# Patient Record
Sex: Female | Born: 1985 | Race: White | Hispanic: No | Marital: Single | Smoking: Former smoker
Health system: Southern US, Community
[De-identification: ages and names within clinical notes are randomized; demographics above are authoritative.]

## PROBLEM LIST (undated history)

## (undated) ENCOUNTER — Encounter: Attending: Allergy & Immunology | Primary: Allergy & Immunology

## (undated) ENCOUNTER — Telehealth

## (undated) ENCOUNTER — Ambulatory Visit
Payer: MEDICARE | Attending: Rehabilitative and Restorative Service Providers" | Primary: Rehabilitative and Restorative Service Providers"

## (undated) ENCOUNTER — Encounter

## (undated) ENCOUNTER — Ambulatory Visit: Payer: MEDICARE

## (undated) ENCOUNTER — Ambulatory Visit: Payer: MEDICARE | Attending: Orthopaedic Surgery | Primary: Orthopaedic Surgery

## (undated) ENCOUNTER — Telehealth
Attending: Student in an Organized Health Care Education/Training Program | Primary: Student in an Organized Health Care Education/Training Program

## (undated) ENCOUNTER — Encounter: Attending: Pharmacist | Primary: Pharmacist

## (undated) ENCOUNTER — Ambulatory Visit

## (undated) ENCOUNTER — Telehealth: Attending: Pediatrics | Primary: Pediatrics

## (undated) ENCOUNTER — Ambulatory Visit: Attending: Allergy & Immunology | Primary: Allergy & Immunology

## (undated) ENCOUNTER — Ambulatory Visit: Attending: Psychiatry | Primary: Psychiatry

## (undated) ENCOUNTER — Telehealth: Attending: Rheumatology | Primary: Rheumatology

## (undated) ENCOUNTER — Telehealth: Attending: Physical Medicine & Rehabilitation | Primary: Physical Medicine & Rehabilitation

## (undated) ENCOUNTER — Telehealth: Attending: Allergy & Immunology | Primary: Allergy & Immunology

## (undated) ENCOUNTER — Encounter: Attending: Pediatrics | Primary: Pediatrics

## (undated) ENCOUNTER — Ambulatory Visit: Payer: MEDICARE | Attending: Allergy & Immunology | Primary: Allergy & Immunology

## (undated) ENCOUNTER — Encounter
Attending: Student in an Organized Health Care Education/Training Program | Primary: Student in an Organized Health Care Education/Training Program

## (undated) ENCOUNTER — Ambulatory Visit: Attending: Pharmacist | Primary: Pharmacist

## (undated) ENCOUNTER — Telehealth: Attending: Family | Primary: Family

## (undated) ENCOUNTER — Ambulatory Visit: Payer: MEDICARE | Attending: Physical Medicine & Rehabilitation | Primary: Physical Medicine & Rehabilitation

## (undated) ENCOUNTER — Encounter: Attending: Physical Medicine & Rehabilitation | Primary: Physical Medicine & Rehabilitation

## (undated) DIAGNOSIS — J45909 Unspecified asthma, uncomplicated: Secondary | ICD-10-CM

## (undated) DIAGNOSIS — M359 Systemic involvement of connective tissue, unspecified: Secondary | ICD-10-CM

## (undated) DIAGNOSIS — I10 Essential (primary) hypertension: Secondary | ICD-10-CM

## (undated) DIAGNOSIS — N289 Disorder of kidney and ureter, unspecified: Secondary | ICD-10-CM

## (undated) HISTORY — PX: VASCULAR SURGERY: SHX849

## (undated) HISTORY — PX: COLON SURGERY: SHX602

## (undated) HISTORY — PX: CERVICAL FUSION: SHX112

## (undated) MED ORDER — DUPILUMAB 300 MG/2 ML SUBCUTANEOUS SYRINGE: SUBCUTANEOUS | 0 days

---
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
## 2007-05-03 ENCOUNTER — Other Ambulatory Visit: Payer: Self-pay

## 2007-05-04 ENCOUNTER — Ambulatory Visit: Payer: Self-pay | Admitting: *Deleted

## 2007-05-04 ENCOUNTER — Inpatient Hospital Stay (HOSPITAL_COMMUNITY): Admission: AD | Admit: 2007-05-04 | Discharge: 2007-05-07 | Payer: Self-pay | Admitting: *Deleted

## 2010-12-06 NOTE — H&P (Signed)
[name], [name] NO.:  [id number]   MEDICAL RECORD NO.:  [id number]          PATIENT TYPE:  IPS   LOCATION:  0301                          FACILITY:  BH   PHYSICIAN:  [name], M.D. DATE OF BIRTH:  1986-06-13   DATE OF ADMISSION:  05/04/2007  DATE OF DISCHARGE:                       PSYCHIATRIC ADMISSION ASSESSMENT   IDENTIFYING INFORMATION:  This is a voluntary admission to the services  of Dr. Milford Macias.  This is a 25 year old single white female.  She  presented as a walk-in here at the [facility] yesterday.  She reported that she had just been discharged from [facility] on  April 29, 2007 after treatment for depression and anorexia.  She was  over here in [place] visiting a friend.  She began to have suicidal  ideation.  She stated that she had been having frequent down moods  since her discharge and she was concerned that she might act on her  suicidal ideation and did not feel safe.  She could not identify any  particular stressors and she reported that she had been eating regularly  and had been compliant with her medications.  She was calm, cooperative  and requesting treatment.  Apparently, she has overdosed on medication  and cut her wrist in the past and this was back in January when she was  admitted to [facility] and she has been trying to avoid suicidal ideation but  it is not working.  She was brought to the [facility] ED for medical  clearance with anticipation of being admitted to [facility].  However, no beds  were available and, hence, she was admitted to the [facility].   PAST PSYCHIATRIC HISTORY:  Most recently discharged from [facility] April 29, 2007.  She also was an inpatient in January of 2008 at [facility].  She states  that for the past eight years she has had treatment for depression and  anorexia including several treatment centers for anorexia including the  [facility] in [place].   SOCIAL  HISTORY:  She currently lives alone in [place].  She works at [employer].  She has never married.  She has no children.  She has taken a  few college courses.  Her mother lives in [place].  Her father lives in  [place] state.   FAMILY HISTORY:  Father and sister abuse alcohol.   ALCOHOL/DRUG HISTORY:  She herself uses marijuana occasionally and has  since age 23.   PRIMARY CARE PHYSICIAN:  Currently, she does not have any identified  physicians.   MEDICAL PROBLEMS:  Orthostatic hypotension and history for anorexia.   POSITIVE PHYSICAL FINDINGS:  She was medically cleared in the ED at  [facility].  Her UDS was negative.  Her alcohol level was less than 5.  Her other labs were within normal limits.  Vital signs, on admission,  show she is 65-1/2 inches tall, she weighs 116 pounds, temperature 98,  blood pressure 117/77, pulse 76-110, respirations 20.  She does have  numerous old self-inflicted scars.  She does have a past history for  having lived  in foster care and group homes.   MEDICATIONS:  She is currently prescribed Effexor XR 300 mg a day and  Lunesta 3 mg at h.s.   ALLERGIES:  No known drug allergies.   MENTAL STATUS EXAM:  Today, she is alert and oriented x3.  She is  appropriately groomed, dressed and nourished.  She has good eye contact.  Her speech is a normal rate, rhythm and tone.  Her mood she reports  feeling depressed at times.  Her affect has a normal range.  Thought  processes are clear, rational and goal-oriented.  She wants to be safe.  Judgment and insight are intact.  Concentration and memory are intact  and intelligence is at least average.  She is having occasional suicidal  ideation.  She denies homicidal ideation and she does not have auditory  or visual hallucinations.   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  History for anorexia nervosa.  AXIS II:  Deferred.  Foster care and group homes.   AXIS III:  Orthostatic hypotension.  AXIS IV:  Moderate.  AXIS V:  35.   PLAN:  To admit for safety and stabilization.  Will get her discharge  summary from [facility].  We will adjust her medications if indicated.  She  will be going to the depression groups.   ESTIMATED LENGTH OF STAY:  Three to four days.      Mickie Leonarda Salon, P.A.-C.      [name], M.D.  Electronically Signed    MD/MEDQ  D:  05/04/2007  T:  05/04/2007  Job:  [id number]

## 2010-12-09 NOTE — Discharge Summary (Signed)
[name], KIMBLER NO.:  [id number]   MEDICAL RECORD NO.:  [id number]          PATIENT TYPE:  IPS   LOCATION:  0601                          FACILITY:  BH   PHYSICIAN:  Jasmine Pang, M.D. DATE OF BIRTH:  09/21/1985   DATE OF ADMISSION:  05/04/2007  DATE OF DISCHARGE:  05/07/2007                               DISCHARGE SUMMARY   IDENTIFYING INFORMATION:  This is a 25 year old single white female who  was admitted on a voluntary basis.   HISTORY OF PRESENT ILLNESS:  The patient presented as a walk-in here at  the [facility] yesterday.  On the day before admission, she  reported she had been discharged from [facility] on April 29, 2007,  after treatment for depression and anorexia.  She was in [place]  visiting a friend, she began to have suicidal ideation.  She stated that  she had been having frequent down moods since her discharge, and she  was concerned that she might act on her suicidal ideation and did not  feel safe.  She could not identify any particular stressors and she  reported that she had been eating regularly and has been compliant with  her medication.  She was calm, cooperative, and requesting treatment.  Apparently, she has overdosed her medication and cut her wrists in the  past.  This was in January when she was admitted to [facility]  Psychiatric Unit.  She states she has been trying to avoid the suicidal  ideation, but it is not working.  She was brought to the [facility]  ED for medical clearance with anticipation of being admitted to [facility].  However, no beds were available and hence she was admitted to [facility].  She was most recently discharged from [facility] on April 29, 2007.  She was also an inpatient in January 2008 at [facility].  She states  that for the past 8 years, she has had treatment for depression and  anorexia including several treatment centers for anorexia.  She states  that her father and  sister abuse alcohol.  She herself uses marijuana  occasionally and has since the age of 72.  She has orthostatic  hypotension.   PHYSICAL FINDINGS:  The patient was medically cleared in the ED at  [facility].  There were no acute physical or medical problems  noted.  Her UDS was negative.  Her alcohol level was less than 5.  Her  other labs were within normal limits done at the ED.   HOSPITAL COURSE:  Upon admission, the patient was continued on her  Effexor XR 300 mg p.o. daily and Lunesta 3 mg p.o. q.h.s.  She was  ordered a rapid strep test due to sore throat.  This was negative.  The  patient tolerated her medications well with no significant side effects.  There were no changes made.  She initially was reserved, friendly, and  cooperative in sessions with me.  She was able to participate  appropriately in unit therapeutic groups and activities.  She talked  about being in treatment on  and off for the past 8 years.  She discussed  her suicidal ideation prior to admit not safe.  She lives in [place]  and works at a [employer] place.  She denies any stressors.  She  states she has no psychiatrist at this point, but does have a therapist.  As hospitalization progressed, mental status continued to be depressed  and very anxious.  Her sleep was good and there was no suicidal  ideation.  She discussed her sister being a big support for her, but  stated that she had conflict with her mother.  She wanted a transfer to  [facility] or [facility], but there were no beds available.  On May 06, 2007, mood had improved.  There was no suicidal ideation.  The patient  felt ready to go home the next day.  On May 07, 2007, mental status  had improved from admission status.  The patient was friendly and  cooperative with good eye contact.  Speech was normal rate and flow.  Psychomotor activity was within normal limits.  Mood euthymic.  Affect  wide range with no suicidal or  homicidal ideation.  No thoughts of self-  injurious behavior.  No auditory or visual hallucinations.  No paranoia  or delusions.  Thoughts were logical and goal-directed.  Thought  content, no predominant theme.  Cognitive was grossly back to baseline.  It was felt the patient was safe to be discharged today.  Her friend was  picking her up and will take her home.  She plans to stay with this  friend for several days since she does not have to be at work  immediately.   DISCHARGE DIAGNOSES:  AXIS I:  Major depression, recurrent, severe  without psychotic features, anorexia nervosa.  AXIS II:  None.  AXIS III:  Orthostatic hypotension and anorexia nervosa.  AXIS IV:  Moderate (burden of psychiatric illness and conflict with  mother).  AXIS V:  Global assessment of functioning was 35 upon admission and 50  upon discharge. Global assessment of functioning highest past year was  55-60.   DISCHARGE PLAN:  There were no specific activity level or dietary  restrictions.   POSTHOSPITAL CARE PLAN:  The patient will be seen at the [facility] Psychiatry  Department.  They were going to call the patient with her appointment.   DISCHARGE MEDICATIONS:  Effexor XR 300 mg daily and Ambien 10 mg at  bedtime.      Jasmine Pang, M.D.  Electronically Signed     BHS/MEDQ  D:  06/03/2007  T:  06/04/2007  Job:  [id number]

## 2011-05-04 LAB — PREGNANCY, URINE: Preg Test, Ur: NEGATIVE

## 2011-05-04 LAB — BASIC METABOLIC PANEL
Chloride: 104
GFR calc Af Amer: >60
Potassium: 4.3

## 2011-05-04 LAB — RAPID STREP SCREEN (MED CTR MEBANE ONLY): Streptococcus, Group A Screen (Direct): NEGATIVE

## 2011-05-04 LAB — ETHANOL: Alcohol, Ethyl (B): <5

## 2017-01-26 ENCOUNTER — Emergency Department
Admission: EM | Admit: 2017-01-26 | Discharge: 2017-01-26 | Disposition: A | Discharge status: Home / Self Care | Payer: MEDICARE | Source: Intra-hospital

## 2017-03-01 ENCOUNTER — Ambulatory Visit: Admission: RE | Admit: 2017-03-01 | Discharge: 2017-03-01 | Payer: MEDICARE

## 2017-03-01 DIAGNOSIS — J45909 Unspecified asthma, uncomplicated: Principal | ICD-10-CM

## 2017-04-08 ENCOUNTER — Emergency Department
Admission: EM | Admit: 2017-04-08 | Discharge: 2017-04-09 | Disposition: A | Discharge status: Home / Self Care | Payer: MEDICARE | Source: Intra-hospital

## 2017-04-08 ENCOUNTER — Ambulatory Visit
Admission: RE | Admit: 2017-04-08 | Discharge: 2017-04-08 | Payer: MEDICARE | Attending: Nurse Practitioner | Admitting: Nurse Practitioner

## 2017-04-08 ENCOUNTER — Emergency Department
Admission: EM | Admit: 2017-04-08 | Discharge: 2017-04-09 | Disposition: A | Discharge status: Home / Self Care | Source: Intra-hospital

## 2017-04-08 DIAGNOSIS — T7840XA Allergy, unspecified, initial encounter: Principal | ICD-10-CM

## 2017-04-09 DIAGNOSIS — R1110 Vomiting, unspecified: Principal | ICD-10-CM

## 2017-04-09 MED ORDER — ONDANSETRON 4 MG DISINTEGRATING TABLET
ORAL_TABLET | Freq: Three times a day (TID) | ORAL | 0 refills | 0.00000 days | Status: CP | PRN
Start: 2017-04-09 — End: 2017-04-16

## 2017-04-13 ENCOUNTER — Emergency Department
Admission: EM | Admit: 2017-04-13 | Discharge: 2017-04-13 | Disposition: A | Discharge status: Home / Self Care | Payer: MEDICARE | Source: Intra-hospital

## 2017-04-13 ENCOUNTER — Emergency Department
Admission: EM | Admit: 2017-04-13 | Discharge: 2017-04-13 | Disposition: A | Discharge status: Home / Self Care | Payer: MEDICARE | Source: Intra-hospital | Attending: Emergency Medicine | Admitting: Emergency Medicine

## 2017-04-13 DIAGNOSIS — R102 Pelvic and perineal pain: Principal | ICD-10-CM

## 2017-04-26 ENCOUNTER — Ambulatory Visit: Admission: RE | Admit: 2017-04-26 | Discharge: 2017-04-26 | Payer: MEDICARE | Admitting: Allergy & Immunology

## 2017-04-26 DIAGNOSIS — J45909 Unspecified asthma, uncomplicated: Secondary | ICD-10-CM

## 2017-04-26 DIAGNOSIS — D8940 Mast cell activation, unspecified: Principal | ICD-10-CM

## 2017-04-26 DIAGNOSIS — L501 Idiopathic urticaria: Secondary | ICD-10-CM

## 2017-04-26 DIAGNOSIS — W57XXXA Bitten or stung by nonvenomous insect and other nonvenomous arthropods, initial encounter: Secondary | ICD-10-CM

## 2017-04-26 DIAGNOSIS — R232 Flushing: Secondary | ICD-10-CM

## 2017-04-26 DIAGNOSIS — R21 Rash and other nonspecific skin eruption: Secondary | ICD-10-CM

## 2017-04-26 DIAGNOSIS — L853 Xerosis cutis: Secondary | ICD-10-CM

## 2017-07-22 ENCOUNTER — Emergency Department
Admission: EM | Admit: 2017-07-22 | Discharge: 2017-07-22 | Disposition: A | Discharge status: Home / Self Care | Payer: MEDICARE | Source: Intra-hospital | Attending: Family | Admitting: Family

## 2017-08-10 ENCOUNTER — Ambulatory Visit: Admit: 2017-08-10 | Discharge: 2017-08-10 | Disposition: A | Discharge status: Home / Self Care | Payer: MEDICARE

## 2017-09-15 ENCOUNTER — Ambulatory Visit
Admit: 2017-09-15 | Discharge: 2017-09-15 | Disposition: A | Discharge status: Home / Self Care | Payer: MEDICARE | Attending: Emergency Medicine

## 2017-09-20 ENCOUNTER — Emergency Department
Admit: 2017-09-20 | Discharge: 2017-09-21 | Disposition: A | Discharge status: Home / Self Care | Payer: MEDICARE | Attending: Emergency Medicine

## 2017-09-20 ENCOUNTER — Ambulatory Visit
Admit: 2017-09-20 | Discharge: 2017-09-21 | Disposition: A | Discharge status: Home / Self Care | Payer: MEDICARE | Attending: Emergency Medicine

## 2017-09-24 ENCOUNTER — Ambulatory Visit
Admit: 2017-09-24 | Discharge: 2017-09-24 | Disposition: A | Discharge status: Home / Self Care | Payer: MEDICARE | Attending: Critical Care Medicine

## 2017-10-11 ENCOUNTER — Ambulatory Visit: Admit: 2017-10-11 | Discharge: 2017-10-11 | Disposition: A | Discharge status: Home / Self Care | Payer: MEDICARE

## 2017-10-11 ENCOUNTER — Emergency Department: Admit: 2017-10-11 | Discharge: 2017-10-11 | Disposition: A | Discharge status: Home / Self Care | Payer: MEDICARE

## 2017-10-11 DIAGNOSIS — S61412A Laceration without foreign body of left hand, initial encounter: Principal | ICD-10-CM

## 2017-10-22 DIAGNOSIS — S61211A Laceration without foreign body of left index finger without damage to nail, initial encounter: Principal | ICD-10-CM

## 2017-10-23 ENCOUNTER — Ambulatory Visit: Admit: 2017-10-23 | Discharge: 2017-10-23 | Payer: MEDICARE

## 2017-10-23 ENCOUNTER — Encounter: Admit: 2017-10-23 | Discharge: 2017-10-23 | Payer: MEDICARE | Attending: Anesthesiology | Primary: Anesthesiology

## 2017-10-23 DIAGNOSIS — S61211A Laceration without foreign body of left index finger without damage to nail, initial encounter: Principal | ICD-10-CM

## 2017-10-23 MED ORDER — HYDROMORPHONE 2 MG TABLET
ORAL_TABLET | ORAL | 0 refills | 0.00000 days | Status: CP | PRN
Start: 2017-10-23 — End: ?

## 2017-10-26 ENCOUNTER — Emergency Department: Admit: 2017-10-26 | Discharge: 2017-10-26 | Disposition: A | Discharge status: Home / Self Care | Payer: MEDICARE

## 2017-10-26 ENCOUNTER — Ambulatory Visit: Admit: 2017-10-26 | Discharge: 2017-10-26 | Disposition: A | Discharge status: Home / Self Care | Payer: MEDICARE

## 2017-10-26 DIAGNOSIS — R229 Localized swelling, mass and lump, unspecified: Principal | ICD-10-CM

## 2017-11-05 ENCOUNTER — Ambulatory Visit: Admit: 2017-11-05 | Discharge: 2017-11-05 | Disposition: A | Discharge status: Home / Self Care | Payer: MEDICARE

## 2017-11-08 ENCOUNTER — Ambulatory Visit
Admit: 2017-11-08 | Discharge: 2017-12-07 | Payer: MEDICARE | Attending: Rehabilitative and Restorative Service Providers" | Primary: Rehabilitative and Restorative Service Providers"

## 2017-11-08 ENCOUNTER — Ambulatory Visit
Admit: 2017-11-08 | Discharge: 2017-11-09 | Payer: MEDICARE | Attending: Orthopaedic Surgery | Primary: Orthopaedic Surgery

## 2017-11-08 DIAGNOSIS — M25642 Stiffness of left hand, not elsewhere classified: Principal | ICD-10-CM

## 2017-11-08 DIAGNOSIS — S6440XA Injury of digital nerve of unspecified finger, initial encounter: Principal | ICD-10-CM

## 2017-11-08 DIAGNOSIS — Z9889 Other specified postprocedural states: Secondary | ICD-10-CM

## 2017-11-12 DIAGNOSIS — Z9889 Other specified postprocedural states: Principal | ICD-10-CM

## 2017-11-12 DIAGNOSIS — R202 Paresthesia of skin: Secondary | ICD-10-CM

## 2017-11-12 DIAGNOSIS — M542 Cervicalgia: Principal | ICD-10-CM

## 2017-11-12 DIAGNOSIS — M25642 Stiffness of left hand, not elsewhere classified: Secondary | ICD-10-CM

## 2017-11-13 ENCOUNTER — Ambulatory Visit
Admit: 2017-11-13 | Discharge: 2017-11-13 | Disposition: A | Discharge status: Home / Self Care | Payer: MEDICARE | Attending: Family

## 2017-11-13 ENCOUNTER — Emergency Department
Admit: 2017-11-13 | Discharge: 2017-11-13 | Disposition: A | Discharge status: Home / Self Care | Payer: MEDICARE | Attending: Family

## 2017-12-06 ENCOUNTER — Ambulatory Visit: Admit: 2017-12-06 | Discharge: 2017-12-07 | Disposition: A | Discharge status: Home / Self Care | Payer: MEDICARE

## 2017-12-31 MED ORDER — SODIUM CHLORIDE 0.9 % FOR NEBULIZATION
Freq: Four times a day (QID) | RESPIRATORY_TRACT | 12 refills | 0.00000 days | Status: CP | PRN
Start: 2017-12-31 — End: 2018-12-31

## 2017-12-31 MED ORDER — NEBULIZER AND COMPRESSOR
Freq: Four times a day (QID) | 1 refills | 0 days | Status: CP | PRN
Start: 2017-12-31 — End: ?

## 2017-12-31 MED ORDER — ALBUTEROL SULFATE HFA 90 MCG/ACTUATION AEROSOL INHALER
Freq: Four times a day (QID) | RESPIRATORY_TRACT | 11 refills | 0 days | Status: CP | PRN
Start: 2017-12-31 — End: 2018-12-17

## 2018-02-18 MED ORDER — HYDROXYCHLOROQUINE 200 MG TABLET
ORAL_TABLET | Freq: Two times a day (BID) | ORAL | 2 refills | 0 days | Status: CP
Start: 2018-02-18 — End: 2018-07-15

## 2018-02-18 MED ORDER — FAMOTIDINE 20 MG TABLET
ORAL_TABLET | Freq: Two times a day (BID) | ORAL | 11 refills | 0.00000 days | Status: SS
Start: 2018-02-18 — End: 2018-03-28

## 2018-02-19 MED ORDER — CROMOLYN 100 MG/5 ML ORAL CONCENTRATE
Freq: Four times a day (QID) | ORAL | 11 refills | 0.00000 days | Status: CP
Start: 2018-02-19 — End: 2019-02-06

## 2018-03-16 ENCOUNTER — Ambulatory Visit
Admit: 2018-03-16 | Discharge: 2018-03-16 | Disposition: A | Discharge status: Home / Self Care | Payer: MEDICARE | Attending: Emergency Medicine

## 2018-03-26 ENCOUNTER — Ambulatory Visit
Admit: 2018-03-26 | Discharge: 2018-03-28 | Disposition: A | Discharge status: Home / Self Care | Payer: MEDICARE | Admitting: Internal Medicine

## 2018-03-28 MED ORDER — ENOXAPARIN 80 MG/0.8 ML SUBCUTANEOUS SYRINGE
INJECTION | Freq: Two times a day (BID) | SUBCUTANEOUS | 0 refills | 0 days | Status: CP
Start: 2018-03-28 — End: ?

## 2018-03-28 MED ORDER — HYDROXYZINE PAMOATE 100 MG CAPSULE
ORAL_CAPSULE | Freq: Every evening | ORAL | 0 refills | 0.00000 days | Status: CP
Start: 2018-03-28 — End: ?

## 2018-03-28 MED ORDER — FAMOTIDINE 40 MG TABLET
ORAL_TABLET | Freq: Two times a day (BID) | ORAL | 0 refills | 0 days | Status: CP
Start: 2018-03-28 — End: 2018-04-27

## 2018-03-28 MED ORDER — HYDROXYZINE PAMOATE 25 MG CAPSULE
ORAL_CAPSULE | Freq: Four times a day (QID) | ORAL | 0 refills | 0 days | PRN
Start: 2018-03-28 — End: 2018-10-16

## 2018-03-28 MED ORDER — FEXOFENADINE 180 MG TABLET
ORAL_TABLET | Freq: Two times a day (BID) | ORAL | 12 refills | 0.00000 days
Start: 2018-03-28 — End: ?

## 2018-04-04 ENCOUNTER — Ambulatory Visit
Admit: 2018-04-04 | Discharge: 2018-04-05 | Payer: MEDICARE | Attending: Allergy & Immunology | Primary: Allergy & Immunology

## 2018-04-04 DIAGNOSIS — D8940 Mast cell activation, unspecified: Principal | ICD-10-CM

## 2018-04-04 DIAGNOSIS — L0100 Impetigo, unspecified: Secondary | ICD-10-CM

## 2018-04-04 DIAGNOSIS — L501 Idiopathic urticaria: Secondary | ICD-10-CM

## 2018-04-04 MED ORDER — MUPIROCIN 2 % TOPICAL OINTMENT
Freq: Three times a day (TID) | TOPICAL | 1 refills | 0 days | Status: CP
Start: 2018-04-04 — End: 2018-04-25

## 2018-04-25 MED ORDER — MUPIROCIN 2 % TOPICAL OINTMENT
Freq: Three times a day (TID) | TOPICAL | 2 refills | 0.00000 days | Status: CP
Start: 2018-04-25 — End: 2018-05-09

## 2018-05-02 ENCOUNTER — Institutional Professional Consult (permissible substitution): Admit: 2018-05-02 | Discharge: 2018-05-03 | Payer: MEDICARE

## 2018-05-02 DIAGNOSIS — L501 Idiopathic urticaria: Principal | ICD-10-CM

## 2018-05-02 DIAGNOSIS — J45909 Unspecified asthma, uncomplicated: Secondary | ICD-10-CM

## 2018-06-08 ENCOUNTER — Emergency Department
Admission: EM | Admit: 2018-06-08 | Discharge: 2018-06-09 | Disposition: A | Discharge status: Home / Self Care | Payer: Medicare Other | Attending: Emergency Medicine | Admitting: Emergency Medicine

## 2018-06-08 DIAGNOSIS — R197 Diarrhea, unspecified: Secondary | ICD-10-CM | POA: Insufficient documentation

## 2018-06-08 DIAGNOSIS — E860 Dehydration: Secondary | ICD-10-CM

## 2018-06-08 DIAGNOSIS — R1084 Generalized abdominal pain: Secondary | ICD-10-CM | POA: Diagnosis not present

## 2018-06-08 DIAGNOSIS — R112 Nausea with vomiting, unspecified: Secondary | ICD-10-CM | POA: Diagnosis not present

## 2018-06-08 DIAGNOSIS — R531 Weakness: Secondary | ICD-10-CM | POA: Diagnosis present

## 2018-06-08 DIAGNOSIS — Z7901 Long term (current) use of anticoagulants: Secondary | ICD-10-CM | POA: Insufficient documentation

## 2018-06-08 HISTORY — DX: Essential (primary) hypertension: I10

## 2018-06-08 HISTORY — DX: Systemic involvement of connective tissue, unspecified: M35.9

## 2018-06-08 HISTORY — DX: Unspecified asthma, uncomplicated: J45.909

## 2018-06-08 HISTORY — DX: Disorder of kidney and ureter, unspecified: N28.9

## 2018-06-08 LAB — CBC
HEMATOCRIT: 44.6 % (ref 36.0–46.0)
Hemoglobin: 15.2 g/dL — ABNORMAL HIGH (ref 12.0–15.0)
MCH: 29.5 pg (ref 26.0–34.0)
MCHC: 34.1 g/dL (ref 30.0–36.0)
MCV: 86.4 fL (ref 80.0–100.0)
Platelets: 229 10*3/uL (ref 150–400)
RBC: 5.16 MIL/uL — AB (ref 3.87–5.11)
RDW: 13.1 % (ref 11.5–15.5)
WBC: 10.3 10*3/uL (ref 4.0–10.5)
nRBC: 0 % (ref 0.0–0.2)

## 2018-06-08 NOTE — ED Triage Notes (Signed)
Pt arrived via EMS from home where she has had N/V and generalized abdominal pain. Tonight pt had near syncopal episode. Pt is hypotensive 67/46.

## 2018-06-09 ENCOUNTER — Encounter: Payer: Self-pay | Admitting: Radiology

## 2018-06-09 ENCOUNTER — Emergency Department: Payer: Medicare Other

## 2018-06-09 LAB — COMPREHENSIVE METABOLIC PANEL
ALT: 31 U/L (ref 0–44)
AST: 30 U/L (ref 15–41)
Albumin: 4.1 g/dL (ref 3.5–5.0)
Alkaline Phosphatase: 42 U/L (ref 38–126)
Anion gap: 7 (ref 5–15)
BUN: 19 mg/dL (ref 6–20)
CHLORIDE: 105 mmol/L (ref 98–111)
CO2: 23 mmol/L (ref 22–32)
Calcium: 9.3 mg/dL (ref 8.9–10.3)
Creatinine, Ser: 1.07 mg/dL — ABNORMAL HIGH (ref 0.44–1.00)
GFR calc Af Amer: >60 mL/min (ref >60)
GFR calc non Af Amer: >60 mL/min (ref >60)
Glucose, Bld: 101 mg/dL — ABNORMAL HIGH (ref 70–99)
POTASSIUM: 4 mmol/L (ref 3.5–5.1)
Sodium: 135 mmol/L (ref 135–145)
Total Bilirubin: 0.6 mg/dL (ref 0.3–1.2)
Total Protein: 7 g/dL (ref 6.5–8.1)

## 2018-06-09 LAB — TROPONIN I

## 2018-06-09 LAB — URINALYSIS, COMPLETE (UACMP) WITH MICROSCOPIC
Bilirubin Urine: NEGATIVE
Glucose, UA: NEGATIVE mg/dL
Ketones, ur: NEGATIVE mg/dL
Leukocytes, UA: NEGATIVE
Nitrite: NEGATIVE
PROTEIN: NEGATIVE mg/dL
Specific Gravity, Urine: 1.019 (ref 1.005–1.030)
pH: 5 (ref 5.0–8.0)

## 2018-06-09 LAB — LIPASE, BLOOD: Lipase: 27 U/L (ref 11–51)

## 2018-06-09 MED ORDER — HYDROMORPHONE HCL 1 MG/ML IJ SOLN
1.0000 mg | Freq: Once | INTRAMUSCULAR | Status: AC
  Administered 2018-06-09: 1 mg via INTRAVENOUS
  Filled 2018-06-09: qty 1

## 2018-06-09 MED ORDER — CIPROFLOXACIN HCL 500 MG PO TABS: 500.0000 mg | ORAL_TABLET | Freq: Two times a day (BID) | ORAL | 0 refills | Status: DC

## 2018-06-09 MED ORDER — ONDANSETRON 4 MG PO TBDP: 4.0000 mg | ORAL_TABLET | Freq: Three times a day (TID) | ORAL | 0 refills | Status: DC | PRN

## 2018-06-09 MED ORDER — IOPAMIDOL (ISOVUE-300) INJECTION 61%
30.0000 mL | Freq: Once | INTRAVENOUS | Status: AC
  Administered 2018-06-09: 30 mL via ORAL

## 2018-06-09 MED ORDER — IOHEXOL 300 MG/ML  SOLN
100.0000 mL | Freq: Once | INTRAMUSCULAR | Status: AC | PRN
  Administered 2018-06-09: 100 mL via INTRAVENOUS

## 2018-06-09 MED ORDER — SODIUM CHLORIDE 0.9 % IV BOLUS
1000.0000 mL | Freq: Once | INTRAVENOUS | Status: AC
  Administered 2018-06-09: 1000 mL via INTRAVENOUS

## 2018-06-09 MED ORDER — ONDANSETRON HCL 4 MG/2ML IJ SOLN
4.0000 mg | Freq: Once | INTRAMUSCULAR | Status: AC
  Administered 2018-06-09: 4 mg via INTRAVENOUS
  Filled 2018-06-09: qty 2

## 2018-06-09 NOTE — ED Provider Notes (Signed)
[facility] Emergency Department Provider Note   ____________________________________________   First MD Initiated Contact with Patient 06/09/18 0032     (approximate)  I have reviewed the triage vital signs and the nursing notes.   HISTORY  Chief Complaint Weakness    HPI [name] is a 32 y.o. female who presents to the ED from home via EMS with a chief complaint of generalized abdominal pain, nausea, vomiting and diarrhea.  Patient reports symptoms x1 week.  Diarrhea worse over the past 2 days.  Tonight she had a near syncopal episode and arrives with a blood pressure of 67/46.  Denies fever, chills, chest pain, shortness of breath.  Denies recent travel or trauma.  Denies urinary or vaginal complaints.   Past medical history Multiple blood clots On Lovenox  There are no active problems to display for this patient.  Past surgical history Abdominal surgery for clot  Prior to Admission medications   Not on File    Allergies Compazine [prochlorperazine]; Morphine and related; Phenergan [promethazine]; Reglan [metoclopramide]; and Vicodin [hydrocodone-acetaminophen]  No family history on file.  Social History Social History   Tobacco Use  . Smoking status: Not on file  Substance Use Topics  . Alcohol use: Not on file  . Drug use: Not on file    Review of Systems  Constitutional: No fever/chills Eyes: No visual changes. ENT: No sore throat. Cardiovascular: Denies chest pain. Respiratory: Denies shortness of breath. Gastrointestinal: Positive for abdominal pain, nausea, vomiting and diarrhea.  No constipation. Genitourinary: Negative for dysuria. Musculoskeletal: Negative for back pain. Skin: Negative for rash. Neurological: Negative for headaches, focal weakness or numbness.   ____________________________________________   PHYSICAL EXAM:  VITAL SIGNS: ED Triage Vitals [06/08/18 2313]  Enc Vitals Group     BP (!)  67/46     Pulse Rate 60     Resp 17     Temp 98 F (36.7 C)     Temp Source Oral     SpO2 98 %     Weight 210 lb (95.3 kg)     Height 5\' 6"  (1.676 m)     Head Circumference      Peak Flow      Pain Score      Pain Loc      Pain Edu?      Excl. in [place]?     Constitutional: Asleep, awakened for exam.  Alert and oriented. Well appearing and in mild acute distress. Eyes: Conjunctivae are normal. PERRL. EOMI. Head: Atraumatic. Nose: No congestion/rhinnorhea. Mouth/Throat: Mucous membranes are moist.  Oropharynx non-erythematous. Neck: No stridor.   Cardiovascular: Normal rate, regular rhythm. Grossly normal heart sounds.  Good peripheral circulation. Respiratory: Normal respiratory effort.  No retractions. Lungs CTAB. Gastrointestinal: Obese.  Soft and diffusely tender to palpation, maximally at right lower quadrant without rebound or guarding. No distention. No abdominal bruits. No CVA tenderness. Musculoskeletal: No lower extremity tenderness nor edema.  No joint effusions. Neurologic:  Normal speech and language. No gross focal neurologic deficits are appreciated. No gait instability. Skin:  Skin is warm, dry and intact. No rash noted. Psychiatric: Mood and affect are normal. Speech and behavior are normal.  ____________________________________________   LABS (all labs ordered are listed, but only abnormal results are displayed)  Labs Reviewed  COMPREHENSIVE METABOLIC PANEL - Abnormal; Notable for the following components:      Result Value   Glucose, Bld 101 (*)    Creatinine, Ser 1.07 (*)  All other components within normal limits  CBC - Abnormal; Notable for the following components:   RBC 5.16 (*)    Hemoglobin 15.2 (*)    All other components within normal limits  URINALYSIS, COMPLETE (UACMP) WITH MICROSCOPIC - Abnormal; Notable for the following components:   Color, Urine YELLOW (*)    APPearance HAZY (*)    Hgb urine dipstick MODERATE (*)    Bacteria, UA RARE  (*)    All other components within normal limits  C DIFFICILE QUICK SCREEN W PCR REFLEX  GASTROINTESTINAL PANEL BY PCR, STOOL (REPLACES STOOL CULTURE)  LIPASE, BLOOD  TROPONIN I  POC URINE PREG, ED   ____________________________________________  EKG  ED ECG REPORT I, SUNG,JADE J, the attending physician, personally viewed and interpreted this ECG.   Date: 06/09/2018  EKG Time: 2318  Rate: 48  Rhythm: sinus bradycardia  Axis: Normal  Intervals:none  ST&T Change: Nonspecific  ____________________________________________  RADIOLOGY  ED MD interpretation: No acute abnormalities on CT scan  Official radiology report(s): Ct Abdomen Pelvis W Contrast  Result Date: 06/09/2018 CLINICAL DATA:  Nausea, vomiting and abdominal pain. Hypotension. EXAM: CT ABDOMEN AND PELVIS WITH CONTRAST TECHNIQUE: Multidetector CT imaging of the abdomen and pelvis was performed using the standard protocol following bolus administration of intravenous contrast. CONTRAST:  156mL OMNIPAQUE IOHEXOL 300 MG/ML  SOLN COMPARISON:  None. FINDINGS: LOWER CHEST: There is no basilar pleural or apical pericardial effusion. HEPATOBILIARY: The hepatic contours and density are normal. There is no intra- or extrahepatic biliary dilatation. Gallbladder is contracted. PANCREAS: The pancreatic parenchymal contours are normal and there is no ductal dilatation. There is no peripancreatic fluid collection. SPLEEN: Normal. ADRENALS/URINARY TRACT: --Adrenal glands: Normal. --Right kidney/ureter: Right renal atrophy. --Left kidney/ureter: No hydronephrosis, nephroureterolithiasis, perinephric stranding or solid renal mass. --Urinary bladder: Normal for degree of distention STOMACH/BOWEL: --Stomach/Duodenum: There is no hiatal hernia or other gastric abnormality. The duodenal course and caliber are normal. --Small bowel: No dilatation or inflammation. Postsurgical changes of small bowel in the left lower quadrant. --Colon: No focal  abnormality. --Appendix: Normal. VASCULAR/LYMPHATIC: Normal course and caliber of the major abdominal vessels. No abdominal or pelvic lymphadenopathy. REPRODUCTIVE: T-shaped contraceptive device in the uterus. Normal ovaries. MUSCULOSKELETAL. No bony spinal canal stenosis or focal osseous abnormality. OTHER: None. IMPRESSION: 1. No acute abdominal or pelvic abnormality. 2. Postsurgical changes of left lower quadrant small bowel. 3. Abnormal appearance of the right kidney, likely chronic atrophy or postsurgical change. Electronically Signed   By: Ulyses Jarred M.D.   On: 06/09/2018 02:35    ____________________________________________   PROCEDURES  Procedure(s) performed: None  Procedures  Critical Care performed: No  ____________________________________________   INITIAL IMPRESSION / ASSESSMENT AND PLAN / ED COURSE  As part of my medical decision making, I reviewed the following data within the [place] notes reviewed and incorporated, Labs reviewed, Old chart reviewed, Radiograph reviewed  and Notes from prior ED visits   32 year old female who presents with diffuse abdominal pain, nausea, vomiting and diarrhea. Differential diagnosis includes, but is not limited to, ovarian cyst, ovarian torsion, acute appendicitis, diverticulitis, urinary tract infection/pyelonephritis, endometriosis, bowel obstruction, colitis, renal colic, gastroenteritis, hernia, fibroids, endometriosis, pregnancy related pain including ectopic pregnancy, etc.  Laboratory and urinalysis results remarkable for moderate hemoglobin and urine.  Will check stool specimen and proceed with CT abdomen/pelvis to evaluate for intra-abdominal etiology.  Clinical Course as of Jun 09 256  Sun Jun 09, 2018  0255 Patient sleeping no acute distress.  Updated her on CT results.  She has not produced stool sample here.  Will start empiric Cipro.  Discharge home on Zofran and she will follow-up with her  PCP next week.  Strict return precautions given.  Patient verbalizes understanding agrees with plan of care.   [JS]    Clinical Course User Index [JS] Paulette Blanch, MD     ____________________________________________   FINAL CLINICAL IMPRESSION(S) / ED DIAGNOSES  Final diagnoses:  Dehydration  Nausea vomiting and diarrhea  Generalized abdominal pain     ED Discharge Orders    None       Note:  This document was prepared using Dragon voice recognition software and may include unintentional dictation errors.    Paulette Blanch, MD 06/09/18 [phone]

## 2018-06-09 NOTE — Discharge Instructions (Addendum)
1.  You may take Zofran as needed for nausea/vomiting. 2.  Take antibiotic as prescribed (Cipro 500 mg twice daily x3 days). 3.  Clear liquids x12 hours, then [employer] x3 days, then slowly advance diet as tolerated. 4.  Return to the ER for worsening symptoms, persistent vomiting, difficulty breathing or other concerns.

## 2018-06-10 LAB — POCT PREGNANCY, URINE: PREG TEST UR: NEGATIVE

## 2018-06-30 ENCOUNTER — Other Ambulatory Visit: Payer: Self-pay

## 2018-06-30 ENCOUNTER — Emergency Department
Admission: EM | Admit: 2018-06-30 | Discharge: 2018-06-30 | Disposition: A | Discharge status: Home / Self Care | Payer: Medicare Other | Attending: Emergency Medicine | Admitting: Emergency Medicine

## 2018-06-30 ENCOUNTER — Encounter: Payer: Self-pay | Admitting: Emergency Medicine

## 2018-06-30 ENCOUNTER — Emergency Department: Payer: Medicare Other

## 2018-06-30 DIAGNOSIS — J45909 Unspecified asthma, uncomplicated: Secondary | ICD-10-CM | POA: Insufficient documentation

## 2018-06-30 DIAGNOSIS — Z87891 Personal history of nicotine dependence: Secondary | ICD-10-CM | POA: Insufficient documentation

## 2018-06-30 DIAGNOSIS — M25511 Pain in right shoulder: Secondary | ICD-10-CM | POA: Diagnosis not present

## 2018-06-30 DIAGNOSIS — I10 Essential (primary) hypertension: Secondary | ICD-10-CM | POA: Diagnosis not present

## 2018-06-30 MED ORDER — PREDNISONE 10 MG (21) PO TBPK: ORAL_TABLET | ORAL | 0 refills | Status: DC

## 2018-06-30 MED ORDER — TIZANIDINE HCL 4 MG PO CAPS: 4.0000 mg | ORAL_CAPSULE | Freq: Three times a day (TID) | ORAL | 0 refills | Status: DC

## 2018-06-30 MED ORDER — ORPHENADRINE CITRATE 30 MG/ML IJ SOLN
60.0000 mg | Freq: Two times a day (BID) | INTRAMUSCULAR | Status: DC
  Administered 2018-06-30: 60 mg via INTRAMUSCULAR
  Filled 2018-06-30: qty 2

## 2018-06-30 MED ORDER — KETOROLAC TROMETHAMINE 30 MG/ML IJ SOLN
30.0000 mg | Freq: Once | INTRAMUSCULAR | Status: AC
  Administered 2018-06-30: 30 mg via INTRAMUSCULAR
  Filled 2018-06-30: qty 1

## 2018-06-30 MED ORDER — OXYCODONE-ACETAMINOPHEN 5-325 MG PO TABS
1.0000 | ORAL_TABLET | Freq: Once | ORAL | Status: AC
  Administered 2018-06-30: 1 via ORAL
  Filled 2018-06-30: qty 1

## 2018-06-30 NOTE — Discharge Instructions (Addendum)
Follow-up with your regular doctor or Dr. Harlow Mares.  Please call for an appointment.  Take the medications as prescribed.

## 2018-06-30 NOTE — ED Triage Notes (Signed)
Patient reports this morning she lifted a weighted blanket off of her. Reports a minute later she got a shooting pain through her right shoulder blade down arm and up neck. Worsening pain with movement.

## 2018-06-30 NOTE — ED Notes (Signed)
weht to discharge pt. Pt not in room. EDP notified. Paper work sent to med rec.

## 2018-06-30 NOTE — ED Provider Notes (Signed)
[facility] Emergency Department Provider Note  ____________________________________________   First MD Initiated Contact with Patient 06/30/18 1223     (approximate)  I have reviewed the triage vital signs and the nursing notes.   HISTORY  Chief Complaint Shoulder Pain    HPI [name] is a 32 y.o. female presents emergency department stating she had a sudden onset of right shoulder pain after lifting a blanket this morning.  She has history of cervical disc problems and other problems.  She has a history of a collagen vascular disease.  She is being followed at the pain clinic for these disorders.  She denies any chest pain or shortness of breath.    Past Medical History:  Diagnosis Date  . Asthma   . Collagen vascular disease ([place])   . Hypertension   . Renal insufficiency     There are no active problems to display for this patient.   Past Surgical History:  Procedure Laterality Date  . CERVICAL FUSION    . COLON SURGERY    . VASCULAR SURGERY      Prior to Admission medications   Medication Sig Start Date End Date Taking? Authorizing Provider  predniSONE (STERAPRED UNI-PAK 21 TAB) 10 MG (21) TBPK tablet Take 6 pills on day one then decrease by 1 pill each day 06/30/18   Versie Starks, PA-C  tiZANidine (ZANAFLEX) 4 MG capsule Take 1 capsule (4 mg total) by mouth 3 (three) times daily. 06/30/18   Versie Starks, PA-C    Allergies Compazine [prochlorperazine]; Morphine and related; Phenergan [promethazine]; Reglan [metoclopramide]; and Vicodin [hydrocodone-acetaminophen]  No family history on file.  Social History Social History   Tobacco Use  . Smoking status: Former Research scientist (life sciences)  . Smokeless tobacco: Never Used  Substance Use Topics  . Alcohol use: Yes    Comment: occasional  . Drug use: Never    Review of Systems  Constitutional: No fever/chills Eyes: No visual changes. ENT: No sore throat. Respiratory: Denies  cough Genitourinary: Negative for dysuria. Musculoskeletal: Negative for back pain.  Positive neck and right shoulder pain Skin: Negative for rash.    ____________________________________________   PHYSICAL EXAM:  VITAL SIGNS: ED Triage Vitals  Enc Vitals Group     BP 06/30/18 1110 (!) 148/107     Pulse Rate 06/30/18 1110 95     Resp 06/30/18 1110 18     Temp 06/30/18 1110 98.9 F (37.2 C)     Temp Source 06/30/18 1110 Oral     SpO2 06/30/18 1110 100 %     Weight 06/30/18 1111 209 lb 15.8 oz (95.2 kg)     Height 06/30/18 1111 5\' 6"  (1.676 m)     Head Circumference --      Peak Flow --      Pain Score 06/30/18 1110 8     Pain Loc --      Pain Edu? --      Excl. in [place]? --     Constitutional: Alert and oriented. Well appearing and in no acute distress. Eyes: Conjunctivae are normal.  Head: Atraumatic. Nose: No congestion/rhinnorhea. Mouth/Throat: Mucous membranes are moist.   Neck:  supple no lymphadenopathy noted Cardiovascular: Normal rate, regular rhythm. Heart sounds are normal Respiratory: Normal respiratory effort.  No retractions, lungs c t a  GU: deferred Musculoskeletal: FROM all extremities, warm and well perfused, pain is reproduced with overhead reach, grips equal bilaterally, trapezius muscle and bursa at the scapular tender. Neurologic:  Normal speech and language.  Skin:  Skin is warm, dry and intact. No rash noted. Psychiatric: Mood and affect are normal. Speech and behavior are normal.  ____________________________________________   LABS (all labs ordered are listed, but only abnormal results are displayed)  Labs Reviewed - No data to display ____________________________________________   ____________________________________________  RADIOLOGY  X-ray of C-spine and right shoulder are negative for any acute abnormality  ____________________________________________   PROCEDURES  Procedure(s) performed: Toradol 30 mg IM and Norflex 60 mg  IM, Percocet 1 PM  Procedures    ____________________________________________   INITIAL IMPRESSION / ASSESSMENT AND PLAN / ED COURSE  Pertinent labs & imaging results that were available during my care of the patient were reviewed by me and considered in my medical decision making (see chart for details).   Patient is 32 year old female presents emergency department complaining of neck and right shoulder pain.  Physical exam shows tenderness along the right shoulder and scapula  X-ray of the C-spine and right shoulder are negative  Explained the findings to the patient.  She had been given Toradol and Norflex without any relief.  She is given a Percocet p.o. which helps some.  She was discharged in stable condition with a prescription for Sterapred and Zanaflex.  She is to follow-up with pain clinic.  Follow-up with Dr. Harlow Mares if needed.  She is discharged stable condition.     As part of my medical decision making, I reviewed the following data within the [place] notes reviewed and incorporated, Old chart reviewed, Radiograph reviewed x-ray of the C-spine and shoulder are negative, Notes from prior ED visits and [place] Controlled Substance Database  ____________________________________________   FINAL CLINICAL IMPRESSION(S) / ED DIAGNOSES  Final diagnoses:  Acute pain of right shoulder      NEW MEDICATIONS STARTED DURING THIS VISIT:  Discharge Medication List as of 06/30/2018  3:20 PM    START taking these medications   Details  predniSONE (STERAPRED UNI-PAK 21 TAB) 10 MG (21) TBPK tablet Take 6 pills on day one then decrease by 1 pill each day, Normal    tiZANidine (ZANAFLEX) 4 MG capsule Take 1 capsule (4 mg total) by mouth 3 (three) times daily., Starting Sun 06/30/2018, Normal         Note:  This document was prepared using Dragon voice recognition software and may include unintentional dictation errors.    Versie Starks,  PA-C 06/30/18 1624    Harvest Dark, MD 07/01/18 [phone]

## 2018-06-30 NOTE — ED Notes (Signed)
Pt states since lifting blanket this morning she can't cough or turn head towards right side without severe pain in shoulder. Can lift arm without pain. States it is sometimes sharp and other times dull. Pain runs into the shoulder blade and neck. Pulses equal in wrists. Appropriate color and size.

## 2018-07-15 MED ORDER — HYDROXYCHLOROQUINE 200 MG TABLET
ORAL_TABLET | Freq: Two times a day (BID) | ORAL | 0 refills | 0 days | Status: CP
Start: 2018-07-15 — End: 2018-12-13

## 2018-07-16 ENCOUNTER — Other Ambulatory Visit: Payer: Self-pay

## 2018-07-16 ENCOUNTER — Encounter: Payer: Self-pay | Admitting: Emergency Medicine

## 2018-07-16 ENCOUNTER — Emergency Department
Admission: EM | Admit: 2018-07-16 | Discharge: 2018-07-17 | Disposition: A | Discharge status: Home / Self Care | Payer: Medicare Other | Source: Home / Self Care | Attending: Emergency Medicine | Admitting: Emergency Medicine

## 2018-07-16 DIAGNOSIS — L501 Idiopathic urticaria: Secondary | ICD-10-CM | POA: Diagnosis not present

## 2018-07-16 DIAGNOSIS — Z79899 Other long term (current) drug therapy: Secondary | ICD-10-CM

## 2018-07-16 DIAGNOSIS — X58XXXA Exposure to other specified factors, initial encounter: Secondary | ICD-10-CM | POA: Diagnosis not present

## 2018-07-16 DIAGNOSIS — N189 Chronic kidney disease, unspecified: Secondary | ICD-10-CM

## 2018-07-16 DIAGNOSIS — Z7951 Long term (current) use of inhaled steroids: Secondary | ICD-10-CM | POA: Diagnosis not present

## 2018-07-16 DIAGNOSIS — I10 Essential (primary) hypertension: Secondary | ICD-10-CM | POA: Diagnosis not present

## 2018-07-16 DIAGNOSIS — M359 Systemic involvement of connective tissue, unspecified: Secondary | ICD-10-CM

## 2018-07-16 DIAGNOSIS — Z23 Encounter for immunization: Secondary | ICD-10-CM | POA: Diagnosis not present

## 2018-07-16 DIAGNOSIS — L509 Urticaria, unspecified: Secondary | ICD-10-CM

## 2018-07-16 DIAGNOSIS — J45909 Unspecified asthma, uncomplicated: Secondary | ICD-10-CM | POA: Insufficient documentation

## 2018-07-16 DIAGNOSIS — T7840XA Allergy, unspecified, initial encounter: Secondary | ICD-10-CM | POA: Diagnosis present

## 2018-07-16 DIAGNOSIS — R000 Tachycardia, unspecified: Secondary | ICD-10-CM | POA: Diagnosis not present

## 2018-07-16 DIAGNOSIS — I129 Hypertensive chronic kidney disease with stage 1 through stage 4 chronic kidney disease, or unspecified chronic kidney disease: Secondary | ICD-10-CM | POA: Insufficient documentation

## 2018-07-16 DIAGNOSIS — M5416 Radiculopathy, lumbar region: Secondary | ICD-10-CM | POA: Diagnosis not present

## 2018-07-16 DIAGNOSIS — Z981 Arthrodesis status: Secondary | ICD-10-CM | POA: Diagnosis not present

## 2018-07-16 DIAGNOSIS — D4702 Systemic mastocytosis: Secondary | ICD-10-CM | POA: Diagnosis not present

## 2018-07-16 DIAGNOSIS — T782XXA Anaphylactic shock, unspecified, initial encounter: Secondary | ICD-10-CM | POA: Diagnosis present

## 2018-07-16 DIAGNOSIS — Z87891 Personal history of nicotine dependence: Secondary | ICD-10-CM | POA: Insufficient documentation

## 2018-07-16 DIAGNOSIS — J9811 Atelectasis: Secondary | ICD-10-CM | POA: Diagnosis not present

## 2018-07-16 DIAGNOSIS — K589 Irritable bowel syndrome without diarrhea: Secondary | ICD-10-CM | POA: Diagnosis not present

## 2018-07-16 DIAGNOSIS — G8929 Other chronic pain: Secondary | ICD-10-CM | POA: Diagnosis not present

## 2018-07-16 DIAGNOSIS — R509 Fever, unspecified: Secondary | ICD-10-CM | POA: Diagnosis present

## 2018-07-16 DIAGNOSIS — Q796 Ehlers-Danlos syndrome, unspecified: Secondary | ICD-10-CM | POA: Diagnosis not present

## 2018-07-16 MED ORDER — FAMOTIDINE IN NACL 20-0.9 MG/50ML-% IV SOLN
20.0000 mg | Freq: Once | INTRAVENOUS | Status: AC
  Administered 2018-07-16: 20 mg via INTRAVENOUS
  Filled 2018-07-16: qty 50

## 2018-07-16 MED ORDER — DIPHENHYDRAMINE HCL 50 MG/ML IJ SOLN
50.0000 mg | Freq: Once | INTRAMUSCULAR | Status: AC
  Administered 2018-07-16: 50 mg via INTRAVENOUS
  Filled 2018-07-16: qty 1

## 2018-07-16 MED ORDER — METHYLPREDNISOLONE SODIUM SUCC 125 MG IJ SOLR
125.0000 mg | Freq: Once | INTRAMUSCULAR | Status: AC
  Administered 2018-07-16: 125 mg via INTRAVENOUS
  Filled 2018-07-16: qty 2

## 2018-07-16 MED ORDER — EPINEPHRINE 0.3 MG/0.3ML IJ SOAJ
0.3000 mg | Freq: Once | INTRAMUSCULAR | Status: AC
  Administered 2018-07-16: 0.3 mg via INTRAMUSCULAR
  Filled 2018-07-16: qty 0.3

## 2018-07-16 NOTE — ED Notes (Signed)
Patient reports itching around cardiac leads, cardiac leads taken off at this time. Patient still on pulse oximetry.

## 2018-07-16 NOTE — ED Notes (Signed)
Patient reports increasing itching. Dr. Archie Balboa at bedside speaking to patient at this time.

## 2018-07-16 NOTE — ED Triage Notes (Signed)
PT states she started having allergic reaction with itching, wheezes and nausea. 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 Pt used epi pen and felt some relief. PT continues to have nausea as complaint. Speaking in clear sentences, RR even and unlabored,

## 2018-07-16 NOTE — ED Provider Notes (Signed)
[facility] Emergency Department Provider Note  ____________________________________________   I have reviewed the triage vital signs and the nursing notes.   HISTORY  Chief Complaint Allergic Reaction   History limited by: Not Limited   HPI [name] is a 32 y.o. female who presents to the emergency department today because of concerns for allergic reaction.  Patient states that she started feeling poorly today.  She was feeling a burning sensation as well as some wheezing and difficulty with breathing.  Patient also had some nausea.  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  She did use her EpiPen and did have some relief however continued to have the burning sensation.  Does have itchiness.  Denies any known specific allergen.  Denies any recent illness.   Per medical record review patient has a history of collagen vascular disease  Past Medical History:  Diagnosis Date  . Asthma   . Collagen vascular disease ([place])   . Hypertension   . Renal insufficiency     There are no active problems to display for this patient.   Past Surgical History:  Procedure Laterality Date  . CERVICAL FUSION    . COLON SURGERY    . VASCULAR SURGERY      Prior to Admission medications   Medication Sig Start Date End Date Taking? Authorizing Provider  predniSONE (STERAPRED UNI-PAK 21 TAB) 10 MG (21) TBPK tablet Take 6 pills on day one then decrease by 1 pill each day 06/30/18   Versie Starks, PA-C  tiZANidine (ZANAFLEX) 4 MG capsule Take 1 capsule (4 mg total) by mouth 3 (three) times daily. 06/30/18   Versie Starks, PA-C    Allergies Compazine [prochlorperazine]; Morphine and related; Phenergan [promethazine]; Reglan [metoclopramide]; and Vicodin [hydrocodone-acetaminophen]  No family history on file.  Social History Social History   Tobacco Use  . Smoking status: Former Research scientist (life sciences)  . Smokeless tobacco: Never Used   Substance Use Topics  . Alcohol use: Yes    Comment: occasional  . Drug use: Never    Review of Systems Constitutional: No fever/chills Eyes: No visual changes. ENT: No sore throat. Cardiovascular: Denies chest pain. Respiratory: Positive for wheezing Gastrointestinal: No abdominal pain.  Positive for nausea.  Genitourinary: Negative for dysuria. Musculoskeletal: Negative for back pain. Skin: Positive for itchiness.  Neurological: Negative for headaches, focal weakness or numbness.  ____________________________________________   PHYSICAL EXAM:  VITAL SIGNS: ED Triage Vitals  Enc Vitals Group     BP 07/16/18 1816 126/89     Pulse Rate 07/16/18 1816 (!) 114     Resp 07/16/18 1816 16     Temp 07/16/18 1816 99.3 F (37.4 C)     Temp Source 07/16/18 1816 Oral     SpO2 07/16/18 1816 99 %     Weight --      Height --      Head Circumference --      Peak Flow --      Pain Score 07/16/18 1818 0   Constitutional: Alert and oriented.  Eyes: Conjunctivae are normal.  ENT      Head: Normocephalic and atraumatic.      Nose: No congestion/rhinnorhea.      Mouth/Throat: Mucous membranes are moist.      Neck: No stridor. Hematological/Lymphatic/Immunilogical: No cervical lymphadenopathy. Cardiovascular: Normal rate, regular rhythm.  No murmurs, rubs, or gallops. Respiratory: Normal respiratory effort without tachypnea nor retractions. Breath sounds are clear and equal bilaterally.  No wheezes/rales/rhonchi. Gastrointestinal: Soft and non tender. No rebound. No guarding.  Genitourinary: Deferred Musculoskeletal: Normal range of motion in all extremities. No lower extremity edema. Neurologic:  Normal speech and language. No gross focal neurologic deficits are appreciated.  Skin:  Skin is warm, dry and intact. No rash noted. Psychiatric: Mood and affect are normal. Speech and behavior are normal. Patient exhibits appropriate insight and  judgment.  ____________________________________________    LABS (pertinent positives/negatives)  None  ____________________________________________   EKG  None  ____________________________________________    RADIOLOGY  None  ____________________________________________   PROCEDURES  Procedures  ____________________________________________   INITIAL IMPRESSION / ASSESSMENT AND PLAN / ED COURSE  Pertinent labs & imaging results that were available during my care of the patient were reviewed by me and considered in my medical decision making (see chart for details).   Patient presented because of concern for an allergic reaction. Initially had some wheezing but that had resolved by the time of my exam. During the time in the emergency department the patient has not had any further respiratory issues. Did have some continued urticaria and pruritis. Patient was given multiple rounds of medication without any significant change in symptoms. Did discuss with patient possibility of admission/observation vs discharge home. Patient did feel comfortable going home and managing with home medications. Did discuss with patient importance of returning for any worsening or respiratory complaints.   ____________________________________________   FINAL CLINICAL IMPRESSION(S) / ED DIAGNOSES  Final diagnoses:  Urticaria     Note: This dictation was prepared with Dragon dictation. Any transcriptional errors that result from this process are unintentional     Nance Pear, MD 07/17/18 [phone]

## 2018-07-17 ENCOUNTER — Other Ambulatory Visit: Payer: Self-pay

## 2018-07-17 ENCOUNTER — Emergency Department: Payer: Medicare Other

## 2018-07-17 ENCOUNTER — Encounter: Payer: Self-pay | Admitting: *Deleted

## 2018-07-17 ENCOUNTER — Observation Stay
Admission: EM | Admit: 2018-07-17 | Discharge: 2018-07-18 | Disposition: A | Discharge status: Home / Self Care | Payer: Medicare Other | Attending: Internal Medicine | Admitting: Internal Medicine

## 2018-07-17 DIAGNOSIS — X58XXXA Exposure to other specified factors, initial encounter: Secondary | ICD-10-CM | POA: Insufficient documentation

## 2018-07-17 DIAGNOSIS — Z981 Arthrodesis status: Secondary | ICD-10-CM | POA: Insufficient documentation

## 2018-07-17 DIAGNOSIS — Z7951 Long term (current) use of inhaled steroids: Secondary | ICD-10-CM | POA: Insufficient documentation

## 2018-07-17 DIAGNOSIS — L501 Idiopathic urticaria: Principal | ICD-10-CM | POA: Insufficient documentation

## 2018-07-17 DIAGNOSIS — K589 Irritable bowel syndrome without diarrhea: Secondary | ICD-10-CM | POA: Insufficient documentation

## 2018-07-17 DIAGNOSIS — Z79899 Other long term (current) drug therapy: Secondary | ICD-10-CM | POA: Insufficient documentation

## 2018-07-17 DIAGNOSIS — Q796 Ehlers-Danlos syndrome, unspecified: Secondary | ICD-10-CM | POA: Insufficient documentation

## 2018-07-17 DIAGNOSIS — T7840XA Allergy, unspecified, initial encounter: Secondary | ICD-10-CM

## 2018-07-17 DIAGNOSIS — L509 Urticaria, unspecified: Secondary | ICD-10-CM | POA: Diagnosis present

## 2018-07-17 DIAGNOSIS — Z87891 Personal history of nicotine dependence: Secondary | ICD-10-CM | POA: Insufficient documentation

## 2018-07-17 DIAGNOSIS — T782XXA Anaphylactic shock, unspecified, initial encounter: Secondary | ICD-10-CM | POA: Insufficient documentation

## 2018-07-17 DIAGNOSIS — Z23 Encounter for immunization: Secondary | ICD-10-CM | POA: Insufficient documentation

## 2018-07-17 DIAGNOSIS — D4702 Systemic mastocytosis: Secondary | ICD-10-CM | POA: Insufficient documentation

## 2018-07-17 DIAGNOSIS — G8929 Other chronic pain: Secondary | ICD-10-CM | POA: Insufficient documentation

## 2018-07-17 DIAGNOSIS — R000 Tachycardia, unspecified: Secondary | ICD-10-CM | POA: Insufficient documentation

## 2018-07-17 DIAGNOSIS — J45909 Unspecified asthma, uncomplicated: Secondary | ICD-10-CM | POA: Insufficient documentation

## 2018-07-17 DIAGNOSIS — J9811 Atelectasis: Secondary | ICD-10-CM | POA: Insufficient documentation

## 2018-07-17 DIAGNOSIS — I10 Essential (primary) hypertension: Secondary | ICD-10-CM | POA: Insufficient documentation

## 2018-07-17 DIAGNOSIS — R509 Fever, unspecified: Secondary | ICD-10-CM

## 2018-07-17 DIAGNOSIS — D8940 Mast cell activation, unspecified: Secondary | ICD-10-CM

## 2018-07-17 DIAGNOSIS — M5416 Radiculopathy, lumbar region: Secondary | ICD-10-CM | POA: Insufficient documentation

## 2018-07-17 LAB — CBC WITH DIFFERENTIAL/PLATELET
ABS IMMATURE GRANULOCYTES: 0.03 10*3/uL (ref 0.00–0.07)
Basophils Absolute: 0 10*3/uL (ref 0.0–0.1)
Basophils Relative: 0 %
Eosinophils Absolute: 0 10*3/uL (ref 0.0–0.5)
Eosinophils Relative: 0 %
HCT: 42.6 % (ref 36.0–46.0)
Hemoglobin: 14.6 g/dL (ref 12.0–15.0)
Immature Granulocytes: 0 %
Lymphocytes Relative: 6 %
Lymphs Abs: 0.5 10*3/uL — ABNORMAL LOW (ref 0.7–4.0)
MCH: 28.5 pg (ref 26.0–34.0)
MCHC: 34.3 g/dL (ref 30.0–36.0)
MCV: 83.2 fL (ref 80.0–100.0)
Monocytes Absolute: 0 10*3/uL — ABNORMAL LOW (ref 0.1–1.0)
Monocytes Relative: 0 %
NEUTROS ABS: 7.8 10*3/uL — AB (ref 1.7–7.7)
NEUTROS PCT: 94 %
PLATELETS: 254 10*3/uL (ref 150–400)
RBC: 5.12 MIL/uL — ABNORMAL HIGH (ref 3.87–5.11)
RDW: 12.5 % (ref 11.5–15.5)
WBC: 8.3 10*3/uL (ref 4.0–10.5)
nRBC: 0 % (ref 0.0–0.2)

## 2018-07-17 LAB — COMPREHENSIVE METABOLIC PANEL
ALT: 47 U/L — ABNORMAL HIGH (ref 0–44)
AST: 35 U/L (ref 15–41)
Albumin: 4.3 g/dL (ref 3.5–5.0)
Alkaline Phosphatase: 43 U/L (ref 38–126)
Anion gap: 12 (ref 5–15)
BUN: 15 mg/dL (ref 6–20)
CHLORIDE: 102 mmol/L (ref 98–111)
CO2: 21 mmol/L — AB (ref 22–32)
Calcium: 9.2 mg/dL (ref 8.9–10.3)
Creatinine, Ser: 0.96 mg/dL (ref 0.44–1.00)
GFR calc Af Amer: >60 mL/min (ref >60)
GFR calc non Af Amer: >60 mL/min (ref >60)
Glucose, Bld: 148 mg/dL — ABNORMAL HIGH (ref 70–99)
Potassium: 4.1 mmol/L (ref 3.5–5.1)
Sodium: 135 mmol/L (ref 135–145)
Total Bilirubin: 0.6 mg/dL (ref 0.3–1.2)
Total Protein: 7.7 g/dL (ref 6.5–8.1)

## 2018-07-17 LAB — URINALYSIS, COMPLETE (UACMP) WITH MICROSCOPIC
Bilirubin Urine: NEGATIVE
Glucose, UA: NEGATIVE mg/dL
Hgb urine dipstick: NEGATIVE
Ketones, ur: 5 mg/dL — AB
Leukocytes, UA: NEGATIVE
Nitrite: NEGATIVE
PROTEIN: 100 mg/dL — AB
Specific Gravity, Urine: 1.027 (ref 1.005–1.030)
Squamous Epithelial / HPF: NONE SEEN (ref 0–5)
pH: 5 (ref 5.0–8.0)

## 2018-07-17 LAB — MRSA PCR SCREENING: MRSA by PCR: NEGATIVE

## 2018-07-17 LAB — URINE DRUG SCREEN, QUALITATIVE (ARMC ONLY)
Amphetamines, Ur Screen: NOT DETECTED
Barbiturates, Ur Screen: NOT DETECTED
Benzodiazepine, Ur Scrn: NOT DETECTED
Cannabinoid 50 Ng, Ur ~~LOC~~: NOT DETECTED
Cocaine Metabolite,Ur ~~LOC~~: NOT DETECTED
MDMA (Ecstasy)Ur Screen: NOT DETECTED
Methadone Scn, Ur: POSITIVE — AB
Opiate, Ur Screen: POSITIVE — AB
Phencyclidine (PCP) Ur S: NOT DETECTED
Tricyclic, Ur Screen: POSITIVE — AB

## 2018-07-17 LAB — ETHANOL: Alcohol, Ethyl (B): <10 mg/dL (ref <10)

## 2018-07-17 LAB — INFLUENZA PANEL BY PCR (TYPE A & B)
INFLBPCR: NEGATIVE
Influenza A By PCR: NEGATIVE

## 2018-07-17 LAB — CG4 I-STAT (LACTIC ACID): Lactic Acid, Venous: 0.57 mmol/L (ref 0.5–1.9)

## 2018-07-17 LAB — POCT PREGNANCY, URINE: Preg Test, Ur: NEGATIVE

## 2018-07-17 MED ORDER — HYDROXYZINE HCL 25 MG PO TABS
25.0000 mg | ORAL_TABLET | Freq: Three times a day (TID) | ORAL | Status: DC
  Filled 2018-07-17: qty 1

## 2018-07-17 MED ORDER — HYDROXYCHLOROQUINE SULFATE 200 MG PO TABS
200.0000 mg | ORAL_TABLET | Freq: Two times a day (BID) | ORAL | Status: DC
  Administered 2018-07-17 – 2018-07-18 (×2): 200 mg via ORAL
  Filled 2018-07-17 (×4): qty 1

## 2018-07-17 MED ORDER — ONDANSETRON HCL 4 MG PO TABS: 4.0000 mg | ORAL_TABLET | Freq: Four times a day (QID) | ORAL | Status: DC | PRN

## 2018-07-17 MED ORDER — FLUTICASONE PROPIONATE HFA 44 MCG/ACT IN AERO: 2.0000 | INHALATION_SPRAY | Freq: Two times a day (BID) | RESPIRATORY_TRACT | Status: DC

## 2018-07-17 MED ORDER — IPRATROPIUM-ALBUTEROL 0.5-2.5 (3) MG/3ML IN SOLN
3.0000 mL | Freq: Four times a day (QID) | RESPIRATORY_TRACT | Status: DC
  Administered 2018-07-17 – 2018-07-18 (×4): 3 mL via RESPIRATORY_TRACT
  Filled 2018-07-17 (×4): qty 3

## 2018-07-17 MED ORDER — ACETAMINOPHEN 500 MG PO TABS
1000.0000 mg | ORAL_TABLET | Freq: Once | ORAL | Status: AC
  Administered 2018-07-17: 1000 mg via ORAL
  Filled 2018-07-17: qty 2

## 2018-07-17 MED ORDER — ENOXAPARIN SODIUM 100 MG/ML ~~LOC~~ SOLN
1.0000 mg/kg | Freq: Two times a day (BID) | SUBCUTANEOUS | Status: DC
  Filled 2018-07-17 (×2): qty 1

## 2018-07-17 MED ORDER — HYDROCORTISONE 1 % EX CREA
TOPICAL_CREAM | Freq: Three times a day (TID) | CUTANEOUS | Status: DC | PRN
  Administered 2018-07-17: 21:00:00 via TOPICAL
  Filled 2018-07-17: qty 28

## 2018-07-17 MED ORDER — SODIUM CHLORIDE 0.9 % IV BOLUS
500.0000 mL | Freq: Once | INTRAVENOUS | Status: AC
  Administered 2018-07-17: 500 mL via INTRAVENOUS

## 2018-07-17 MED ORDER — DILTIAZEM HCL 60 MG PO TABS
60.0000 mg | ORAL_TABLET | Freq: Three times a day (TID) | ORAL | Status: DC
  Administered 2018-07-17 – 2018-07-18 (×2): 60 mg via ORAL
  Filled 2018-07-17 (×5): qty 1

## 2018-07-17 MED ORDER — DOCUSATE SODIUM 100 MG PO CAPS
100.0000 mg | ORAL_CAPSULE | Freq: Two times a day (BID) | ORAL | Status: DC
  Administered 2018-07-18: 100 mg via ORAL
  Filled 2018-07-17 (×3): qty 1

## 2018-07-17 MED ORDER — SODIUM CHLORIDE 0.9 % IV BOLUS
1000.0000 mL | Freq: Once | INTRAVENOUS | Status: AC
  Administered 2018-07-17: 1000 mL via INTRAVENOUS

## 2018-07-17 MED ORDER — LORATADINE 10 MG PO TABS
10.0000 mg | ORAL_TABLET | Freq: Every day | ORAL | Status: DC
  Administered 2018-07-18: 10 mg via ORAL
  Filled 2018-07-17: qty 1

## 2018-07-17 MED ORDER — DEXAMETHASONE SODIUM PHOSPHATE 10 MG/ML IJ SOLN
10.0000 mg | Freq: Once | INTRAMUSCULAR | Status: AC
  Administered 2018-07-17: 10 mg via INTRAVENOUS
  Filled 2018-07-17: qty 1

## 2018-07-17 MED ORDER — FAMOTIDINE IN NACL 20-0.9 MG/50ML-% IV SOLN
20.0000 mg | Freq: Once | INTRAVENOUS | Status: AC
  Administered 2018-07-17: 20 mg via INTRAVENOUS
  Filled 2018-07-17: qty 50

## 2018-07-17 MED ORDER — BUDESONIDE 0.25 MG/2ML IN SUSP
0.2500 mg | Freq: Two times a day (BID) | RESPIRATORY_TRACT | Status: DC
  Administered 2018-07-17 – 2018-07-18 (×2): 0.25 mg via RESPIRATORY_TRACT
  Filled 2018-07-17 (×2): qty 2

## 2018-07-17 MED ORDER — METHYLPREDNISOLONE SODIUM SUCC 125 MG IJ SOLR
125.0000 mg | Freq: Four times a day (QID) | INTRAMUSCULAR | Status: DC
  Administered 2018-07-17 – 2018-07-18 (×4): 125 mg via INTRAVENOUS
  Filled 2018-07-17 (×4): qty 2

## 2018-07-17 MED ORDER — HYDROXYZINE HCL 10 MG PO TABS
10.0000 mg | ORAL_TABLET | Freq: Three times a day (TID) | ORAL | Status: DC | PRN
  Filled 2018-07-17: qty 1

## 2018-07-17 MED ORDER — SODIUM CHLORIDE 0.9 % IV SOLN
INTRAVENOUS | Status: DC
  Administered 2018-07-17 – 2018-07-18 (×2): via INTRAVENOUS

## 2018-07-17 MED ORDER — GABAPENTIN 300 MG PO CAPS
300.0000 mg | ORAL_CAPSULE | Freq: Every day | ORAL | Status: DC
  Administered 2018-07-17: 300 mg via ORAL
  Filled 2018-07-17: qty 1

## 2018-07-17 MED ORDER — DIPHENHYDRAMINE HCL 50 MG/ML IJ SOLN
25.0000 mg | Freq: Once | INTRAMUSCULAR | Status: AC
  Administered 2018-07-17: 25 mg via INTRAVENOUS
  Filled 2018-07-17: qty 1

## 2018-07-17 MED ORDER — MONTELUKAST SODIUM 10 MG PO TABS
10.0000 mg | ORAL_TABLET | Freq: Every evening | ORAL | Status: DC
  Administered 2018-07-17: 10 mg via ORAL
  Filled 2018-07-17 (×2): qty 1

## 2018-07-17 MED ORDER — ENOXAPARIN SODIUM 40 MG/0.4ML ~~LOC~~ SOLN: 40.0000 mg | SUBCUTANEOUS | Status: DC

## 2018-07-17 MED ORDER — INFLUENZA VAC SPLIT QUAD 0.5 ML IM SUSY
0.5000 mL | PREFILLED_SYRINGE | INTRAMUSCULAR | Status: AC
  Administered 2018-07-18: 0.5 mL via INTRAMUSCULAR
  Filled 2018-07-17: qty 0.5

## 2018-07-17 MED ORDER — HYDROXYZINE HCL 50 MG/ML IM SOLN
25.0000 mg | Freq: Four times a day (QID) | INTRAMUSCULAR | Status: DC | PRN
  Filled 2018-07-17 (×2): qty 0.5

## 2018-07-17 MED ORDER — ONDANSETRON HCL 4 MG/2ML IJ SOLN
4.0000 mg | Freq: Four times a day (QID) | INTRAMUSCULAR | Status: DC | PRN
  Administered 2018-07-17: 4 mg via INTRAVENOUS
  Filled 2018-07-17: qty 2

## 2018-07-17 MED ORDER — DIPHENHYDRAMINE HCL 25 MG PO CAPS
25.0000 mg | ORAL_CAPSULE | Freq: Four times a day (QID) | ORAL | Status: DC | PRN
  Administered 2018-07-17: 25 mg via ORAL
  Filled 2018-07-17: qty 1

## 2018-07-17 MED ORDER — TAPENTADOL HCL ER 50 MG PO TB12
150.0000 mg | ORAL_TABLET | Freq: Two times a day (BID) | ORAL | Status: DC
  Administered 2018-07-17 – 2018-07-18 (×2): 150 mg via ORAL
  Filled 2018-07-17 (×2): qty 3

## 2018-07-17 MED ORDER — TRAZODONE HCL 50 MG PO TABS
25.0000 mg | ORAL_TABLET | Freq: Every evening | ORAL | Status: DC | PRN
  Filled 2018-07-17: qty 1

## 2018-07-17 MED ORDER — LINACLOTIDE 145 MCG PO CAPS
145.0000 ug | ORAL_CAPSULE | Freq: Every day | ORAL | Status: DC
  Administered 2018-07-18: 290 ug via ORAL
  Filled 2018-07-17: qty 2

## 2018-07-17 MED ORDER — SUCRALFATE 1 G PO TABS
1.0000 g | ORAL_TABLET | Freq: Three times a day (TID) | ORAL | Status: DC
  Administered 2018-07-17 – 2018-07-18 (×2): 1 g via ORAL
  Filled 2018-07-17 (×3): qty 1

## 2018-07-17 MED ORDER — BISACODYL 5 MG PO TBEC: 5.0000 mg | DELAYED_RELEASE_TABLET | Freq: Every day | ORAL | Status: DC | PRN

## 2018-07-17 MED ORDER — DRONABINOL 2.5 MG PO CAPS
10.0000 mg | ORAL_CAPSULE | Freq: Two times a day (BID) | ORAL | Status: DC
  Administered 2018-07-17 – 2018-07-18 (×2): 10 mg via ORAL
  Filled 2018-07-17 (×3): qty 4

## 2018-07-17 MED ORDER — PROPRANOLOL HCL 20 MG PO TABS: 10.0000 mg | ORAL_TABLET | ORAL | Status: DC

## 2018-07-17 MED ORDER — CROMOLYN SODIUM 100 MG/5ML PO CONC: 100.0000 mg | Freq: Three times a day (TID) | ORAL | Status: DC

## 2018-07-17 NOTE — ED Notes (Signed)
I- Stat Lactic 0.57 RN notifed

## 2018-07-17 NOTE — ED Triage Notes (Signed)
Pt states she is having recurrence of allergic reaction. Pt has extensive medical hx r/t allergic reactions, overproduction of mast cells. Pt is tremulous and flushed.

## 2018-07-17 NOTE — ED Provider Notes (Signed)
Vitals:   07/17/18 0726 07/17/18 0915  BP: (!) 150/90 (!) 141/86  Pulse: 81 100  Resp: 18 20  Temp:    SpO2: 98% 98%     Reevaluated the patient, and with movements and attempt to get up she becomes quite tachycardic.  She is also noticed that she is having some slight increasing edema in her hands.  Oropharynx and lung sounds are clear.  She does however demonstrate peripheral edema, mostly notable in the hands, and she becomes notably tachycardic with a heart rate of 130 even attempting to decide the bed to stand.  She feels like her reaction is ongoing, shows no hives but this face is slightly erythematous.  We will give additional Benadryl at this time, additional IV fluids.  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  Is been given IV steroid.  Discussed with the patient at this point will decide to hospitalize her for ongoing care due to what appears to be a prolonged episode.  Patient is in agreement.  Also her wedding band on her left hand she is having trouble removing from the finger, will attempt to utilize soap to remove it and nurse will work with her to attempt to remove it.  If unable to remove, discussed need to possibly cut it off but the patient reports she would not allow this at this time but would notify start having pain or noticed that the finger started to have discoloration and she really does not want to have her wedding ring cut and she reports that when this happens the swelling tends to go up and down and she will attempt to remove it once the swelling has subsided slightly.  Patient admitted to hospitalist service.   Delman Kitten, MD 07/18/18 1213

## 2018-07-17 NOTE — Progress Notes (Signed)
Scheduled breathing treatment stopped early due to increased heart rate. Patient states her heart rate is usually high. Pretreatment heart rate at 112. Heart rate checked again during treatment and found to be 157. RT checked heart rate by hand and found patient heart rate to be approximately 130. Treatment stopped, RN notified. Patient is not in distress, sitting in bed talking, clear breath sounds. SAT at 97% on room air. Will continue to monitor.

## 2018-07-17 NOTE — ED Notes (Signed)
Called to room by pt who states that she is beginning to have symptoms again, states dizziness, nausea, tachycardia, skin burning and itching.  Dr. Jacqualine Code notified.  Will monitor.

## 2018-07-17 NOTE — Discharge Instructions (Addendum)
You may alternate Tylenol and Ibuprofen every 4 hours as needed for continued fever greater than 100.4 F. Continue your medicines at home as directed by your doctor. Return to the ER for worsening symptoms, persistent vomiting, difficulty breathing or other concerns.

## 2018-07-17 NOTE — Discharge Instructions (Addendum)
Please seek medical attention for any high fevers, chest pain, shortness of breath, change in behavior, persistent vomiting, bloody stool or any other new or concerning symptoms.  

## 2018-07-17 NOTE — Progress Notes (Signed)
Chaplain responded to an OR for AD creation/update. Patient asked to receive education on 07/18/2018. The OR will be forwarded to the incoming chaplain.

## 2018-07-17 NOTE — ED Notes (Signed)
Patient verbalized understanding of discharge instructions, no questions. Patient ambulated out of ED with steady gait in no distress.  

## 2018-07-17 NOTE — ED Triage Notes (Signed)
Pt describes sxs of frequency, urgency, and dysuria. Pt states recurrent UTI's. Pt c/o chronic back pain that she relates to other comorbidities.

## 2018-07-17 NOTE — ED Notes (Signed)
Dr. Jacqualine Code requesting to walk pt and then obtain orthostatic VS.  Upon entering room, pt c/o that no one has turned on the lights to assess her c/o.  Pt also c/o that she was ringing the call bell for at least 15 mins before someone answered.  This RN apologized to pt for the confusion.  Pt does become tachycardic with any movement, but quickly normalizes with rest.  BP remains stable despite position changes.  PT placed back on cardiac monitor and BP.  Dr. Jacqualine Code notified of same and will continue to monitor.

## 2018-07-17 NOTE — ED Provider Notes (Signed)
[facility] Emergency Department Provider Note   ____________________________________________   First MD Initiated Contact with Patient 07/17/18 [phone]     (approximate)  I have reviewed the triage vital signs and the nursing notes.   HISTORY  Chief Complaint Allergic Reaction    HPI [name] is a 32 y.o. female brought to the ED via ambulance from a parking lot with a chief complaint of allergic reaction.  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 immunology.  Patient was seen last evening in the ED for same.  Felt like she was flushed, itchy, burning sensation all over, wheezing, nausea, diarrhea.  States the medicine she was given in the ED did not really help her.  Went home but felt bad again.  Gave herself another EpiPen.  This would make her third in the past 36 hours.  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  She does also complain of myalgias and dysuria.  Denies chest pain, shortness of breath, abdominal pain.  Denies recent travel or trauma.   Past Medical History:  Diagnosis Date  . Asthma   . Collagen vascular disease ([place])   . Hypertension   . Renal insufficiency     There are no active problems to display for this patient.   Past Surgical History:  Procedure Laterality Date  . CERVICAL FUSION    . COLON SURGERY    . VASCULAR SURGERY      Prior to Admission medications   Medication Sig Start Date End Date Taking? Authorizing Provider  albuterol (PROVENTIL,VENTOLIN) 2 MG/5ML syrup Take 2 mg by mouth every 6 (six) hours as needed.    [provider]  cloNIDine (CATAPRES) 0.1 MG tablet Take 0.1 mg by mouth at bedtime as needed for sleep. 05/23/18   [provider]  cromolyn (GASTROCROM) 100 MG/5ML solution Take 100 mg by mouth 4 (four) times daily -  with meals and at bedtime. 02/19/18   [provider]    cyanocobalamin (,VITAMIN B-12,) 1000 MCG/ML injection Inject 1,000 mcg into the muscle every 30 (thirty) days.    [provider]  dronabinol (MARINOL) 10 MG capsule Take 10 mg by mouth 2 (two) times daily before a meal. 10/24/15   [provider]  enoxaparin (LOVENOX) 80 MG/0.8ML injection Inject 80 mg into the skin every 12 (twelve) hours. 03/28/18   [provider]  EPINEPHrine 0.3 mg/0.3 mL IJ SOAJ injection Inject 0.3 mg into the muscle as needed for anaphylaxis.    [provider]  esomeprazole (NEXIUM) 40 MG capsule Take 40 mg by mouth daily before breakfast. 05/03/18   [provider]  fexofenadine (ALLEGRA) 180 MG tablet Take 180 mg by mouth daily as needed for allergies.    [provider]  fluticasone (FLOVENT HFA) 44 MCG/ACT inhaler Inhale 2 puffs into the lungs 2 (two) times daily. 10/22/17   [provider]  gabapentin (NEURONTIN) 100 MG capsule Take 100 mg by mouth 3 (three) times daily as needed for pain. 03/07/18 03/07/19  [provider]  hydroxychloroquine (PLAQUENIL) 200 MG tablet Take 200 mg by mouth 2 (two) times daily.  07/15/19  [provider]  hydrOXYzine (VISTARIL) 25 MG capsule Take 25 mg by mouth 4 (four) times daily as needed for itching. 12/05/17   [provider]  ketotifen (ZADITOR) 0.025 % ophthalmic solution Take 3 drops by mouth See  admin instructions. Use 3 drops in small amount of water and take up to 4 times daily as needed    [provider]  linaclotide (LINZESS) 145 MCG CAPS capsule Take 145-290 mcg by mouth daily as needed for constipation.    [provider]  LORazepam (ATIVAN) 0.5 MG tablet Place 0.5 mg under the tongue daily as needed for nausea.    [provider]  midodrine (PROAMATINE) 2.5 MG tablet Take 2.5 mg by mouth 3 (three) times daily as needed (hypotension).    [provider]  montelukast (SINGULAIR) 5 MG chewable tablet Chew 10  mg by mouth every evening.    [provider]  omalizumab Arvid Right) 150 MG injection Inject 300 mg into the skin every 21 ( twenty-one) days.    [provider]  predniSONE (STERAPRED UNI-PAK 21 TAB) 10 MG (21) TBPK tablet Take 6 pills on day one then decrease by 1 pill each day Patient not taking: Reported on 07/16/2018 06/30/18   Versie Starks, PA-C  propranolol (INDERAL) 10 MG tablet Take 10 mg by mouth See admin instructions. Take 1 tablet (10MG ) every 8 hours and as needed for heart rate >140 03/13/18   [provider]  sodium chloride 0.9 % nebulizer solution Inhale 3 mLs into the lungs every 6 (six) hours as needed for cough. 12/31/17 12/31/18  [provider]  sucralfate (CARAFATE) 1 g tablet Take 1 g by mouth 4 (four) times daily as needed.    [provider]  tapentadol HCl (NUCYNTA) 75 MG tablet Take 75 mg by mouth every 4 (four) hours as needed for pain. 06/12/18   [provider]  Tapentadol HCl 150 MG TB12 Take 150 mg by mouth every 12 (twelve) hours. 06/05/18 08/04/18  [provider]  tiZANidine (ZANAFLEX) 4 MG tablet Take 2-4 mg by mouth 3 (three) times daily as needed for muscle spasms. 03/22/18 03/22/19  [provider]    Allergies Compazine [prochlorperazine]; Morphine and related; Phenergan [promethazine]; Reglan [metoclopramide]; and Vicodin [hydrocodone-acetaminophen]  History reviewed. No pertinent family history.  Social History Social History   Tobacco Use  . Smoking status: Former Research scientist (life sciences)  . Smokeless tobacco: Never Used  Substance Use Topics  . Alcohol use: Yes    Comment: occasional  . Drug use: Yes    Types: Marijuana    Review of Systems  Constitutional: Positive for fever.  Positive for myalgias. Eyes: No visual changes. ENT: No sore throat. Cardiovascular: Denies chest pain. Respiratory: Denies shortness of breath. Gastrointestinal: No abdominal pain.  Positive for vomiting.  No  diarrhea.  No constipation. Genitourinary: Positive for dysuria. Musculoskeletal: Negative for back pain. Skin: Negative for rash. Neurological: Negative for headaches, focal weakness or numbness.   ____________________________________________   PHYSICAL EXAM:  VITAL SIGNS: ED Triage Vitals  Enc Vitals Group     BP 07/17/18 0325 (!) 148/117     Pulse Rate 07/17/18 0325 (!) 120     Resp 07/17/18 0325 14     Temp 07/17/18 0325 (!) 100.9 F (38.3 C)     Temp Source 07/17/18 0325 Oral     SpO2 07/17/18 0325 99 %     Weight 07/17/18 0331 209 lb 15.8 oz (95.2 kg)     Height 07/17/18 0331 5\' 6"  (1.676 m)     Head Circumference --      Peak Flow --      Pain Score 07/17/18 0327 6     Pain Loc --  Pain Edu? --      Excl. in [place]? --     Constitutional: Alert and oriented. Well appearing and in no acute distress. Eyes: Conjunctivae are normal. PERRL. EOMI. Head: Atraumatic. Nose:Congestion/rhinnorhea. Mouth/Throat: Mucous membranes are moist.  Oropharynx non-erythematous.  No tongue or facial swelling.  There is no hoarse or muffled voice.  There is no drooling. Neck: No stridor.  Supple neck without meningismus. Cardiovascular: Tachycardic rate, regular rhythm. Grossly normal heart sounds.  Good peripheral circulation. Respiratory: Normal respiratory effort.  No retractions. Lungs CTAB.  No wheezing appreciated. Gastrointestinal: Soft and nontender to light or deep palpation. No distention. No abdominal bruits. No CVA tenderness. Musculoskeletal: No lower extremity tenderness nor edema.  No joint effusions. Neurologic:  Normal speech and language. No gross focal neurologic deficits are appreciated. No gait instability. Skin:  Skin is warm, dry and intact. No rash noted.  No urticaria noted.  No petechiae. Psychiatric: Mood and affect are normal. Speech and behavior are normal.  ____________________________________________   LABS (all labs ordered are listed, but only  abnormal results are displayed)  Labs Reviewed  CBC WITH DIFFERENTIAL/PLATELET - Abnormal; Notable for the following components:      Result Value   RBC 5.12 (*)    Neutro Abs 7.8 (*)    Lymphs Abs 0.5 (*)    Monocytes Absolute 0.0 (*)    All other components within normal limits  COMPREHENSIVE METABOLIC PANEL - Abnormal; Notable for the following components:   CO2 21 (*)    Glucose, Bld 148 (*)    ALT 47 (*)    All other components within normal limits  URINALYSIS, COMPLETE (UACMP) WITH MICROSCOPIC - Abnormal; Notable for the following components:   Color, Urine YELLOW (*)    APPearance TURBID (*)    Ketones, ur 5 (*)    Protein, ur 100 (*)    Bacteria, UA RARE (*)    All other components within normal limits  URINE DRUG SCREEN, QUALITATIVE ([facility] ONLY) - Abnormal; Notable for the following components:   Tricyclic, Ur Screen POSITIVE (*)    Opiate, Ur Screen POSITIVE (*)    Methadone Scn, Ur POSITIVE (*)    All other components within normal limits  ETHANOL  INFLUENZA PANEL BY PCR (TYPE A & B)  POC URINE PREG, ED  POCT PREGNANCY, URINE  I-STAT CG4 LACTIC ACID, ED  CG4 I-STAT (LACTIC ACID)   ____________________________________________  EKG  ED ECG REPORT I, Izella Ybanez J, the attending physician, personally viewed and interpreted this ECG.   Date: 07/17/2018  EKG Time: 0326  Rate: 117  Rhythm: sinus tachycardia  Axis: Normal  Intervals:none  ST&T Change: Nonspecific  ____________________________________________  RADIOLOGY  ED MD interpretation: No acute cardiopulmonary process  Official radiology report(s): Dg Chest 2 View  Result Date: 07/17/2018 CLINICAL DATA:  Acute onset of fever. Allergic reaction. EXAM: CHEST - 2 VIEW COMPARISON:  None. FINDINGS: The lungs are well-aerated. Minimal bibasilar atelectasis is noted. There is no evidence of pleural effusion or pneumothorax. The heart is normal in size; the mediastinal contour is within normal limits. No  acute osseous abnormalities are seen. Cervical spinal fusion hardware is noted. IMPRESSION: Minimal bibasilar atelectasis noted; lungs otherwise clear. Electronically Signed   By: Garald Balding M.D.   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  I discussed with patient and she is agreeable to allergic reaction cocktail minus EpiPen.  Since she had Solu-Medrol ready, will administer Decadron and reassess.  Given her low-grade fever, will obtain lab work, urinalysis and chest x-ray.   Clinical Course as of Jul 18 711  Wed Jul 17, 2018  0603 Updated patient on all test results.  She was a difficult IV stick so just got her medications about 15 minutes ago.  Voices no complaints currently.  We will continue to monitor.  Patient asked me to dim her light so she can get some sleep.  Heart rate improving with IV fluids.   [JS]  [id number] Updated patient on negative lactate.  She is feeling significantly better.  IV fluids are infusing.  At this point, patient feels like she would like to be discharged home to spend Christmas with her family.  If she feels worse after completion of IV fluids and desires admission, I think that would be reasonable to observe her in the hospital given that she has had 3 EpiPen's in the last 24 to 36 hours.  Care transferred to the oncoming provider Dr. Jacqualine Code.  Plan at this point is discharge home after completion of IV fluids and reassessment.  Patient has  all prescriptions and medicines at home.   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    Paulette Blanch, MD 07/17/18 [phone]

## 2018-07-17 NOTE — H&P (Signed)
[place] [facility] [place] NAME: [name]    MR#:  [id number]  DATE OF BIRTH:  [birth year]  DATE OF ADMISSION:  07/17/2018  PRIMARY CARE PHYSICIAN: Illene Bolus, MD   REQUESTING/REFERRING PHYSICIAN: Dr. Delman Kitten  CHIEF COMPLAINT: Generalized pruritus   Chief Complaint  Patient presents with  . Allergic Reaction    HISTORY OF PRESENT ILLNESS:  [name]  is a 32 y.o. 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  Patient was seen in the emergency room one time and was given Benadryl, prednisone and sent home but comes back again because she was feeling poor with feeling itching all over the body along with nausea, feeling of flushing.  Noted to have tachycardia with heart rate up to 140 bpm no hypoxia.  Because patient was seen in the emergency room yesterday and also comes in today again with similar problem now has tachycardia and flushing ER physician asked me to admit the patient.  Patient has history of Ehlers-Danlos syndrome, follows up with [facility] rheumatology, also has history of POTS.  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  Main complaint is feeling of itching especially front and back of upper part of the chest, flushing feeling.  She has history of idiopathic urticaria and followed by allergist immunologist at [facility]. PAST MEDICAL HISTORY:   Past Medical History:  Diagnosis Date  . Asthma   . Collagen vascular disease ([place])   . Hypertension   . Renal insufficiency     PAST SURGICAL HISTOIRY:   Past Surgical History:  Procedure Laterality Date  . CERVICAL FUSION    . COLON SURGERY    . VASCULAR SURGERY      SOCIAL HISTORY:   Social History   Tobacco Use  . Smoking status: Former Research scientist (life sciences)  . Smokeless tobacco: Never Used   Substance Use Topics  . Alcohol use: Yes    Comment: occasional    FAMILY HISTORY:  History reviewed. No pertinent family history.  DRUG ALLERGIES:   Allergies  Allergen Reactions  . Compazine [Prochlorperazine]   . Morphine And Related   . Phenergan [Promethazine]   . Reglan [Metoclopramide]   . Vicodin [Hydrocodone-Acetaminophen]     REVIEW OF SYSTEMS:  CONSTITUTIONAL: Has flushed feeling, nausea.  EYES: No blurred or double vision.  EARS, NOSE, AND THROAT: No tinnitus or ear pain.  RESPIRATORY: No cough, shortness of breath, wheezing or hemoptysis.  CARDIOVASCULAR: No chest pain, orthopnea, edema.  GASTROINTESTINAL: abdominal cramps  GENITOURINARY: No dysuria, hematuria.  ENDOCRINE: No polyuria, nocturia,  HEMATOLOGY: No anemia, easy bruising or bleeding SKIN: No rash or lesion. MUSCULOSKELETAL: No joint pain or arthritis.   NEUROLOGIC: No tingling, numbness, weakness.  PSYCHIATRY: Anxiety MEDICATIONS AT HOME:   Prior to Admission medications   Medication Sig Start Date End Date Taking? Authorizing Provider  albuterol (PROVENTIL,VENTOLIN) 2 MG/5ML syrup Take 2 mg by mouth every 6 (six) hours as needed.   Yes [provider]  cloNIDine (CATAPRES) 0.1 MG tablet Take 0.1 mg by mouth at bedtime as needed for sleep. 05/23/18  Yes [provider]  cromolyn (GASTROCROM) 100 MG/5ML solution Take 100 mg by mouth 4 (four) times daily -  with meals and at bedtime. 02/19/18  Yes [provider]  cyanocobalamin (,VITAMIN B-12,) 1000 MCG/ML injection Inject  1,000 mcg into the muscle every 30 (thirty) days.   Yes [provider]  dronabinol (MARINOL) 10 MG capsule Take 10 mg by mouth 2 (two) times daily before a meal. 10/24/15  Yes [provider]  enoxaparin (LOVENOX) 80 MG/0.8ML injection Inject 80 mg into the skin every 12 (twelve) hours. 03/28/18  Yes [provider]  EPINEPHrine 0.3 mg/0.3 mL IJ SOAJ injection Inject 0.3 mg into  the muscle as needed for anaphylaxis.   Yes [provider]  esomeprazole (NEXIUM) 40 MG capsule Take 40 mg by mouth daily before breakfast. 05/03/18  Yes [provider]  fexofenadine (ALLEGRA) 180 MG tablet Take 180 mg by mouth daily as needed for allergies.   Yes [provider]  fluticasone (FLOVENT HFA) 44 MCG/ACT inhaler Inhale 2 puffs into the lungs 2 (two) times daily. 10/22/17  Yes [provider]  gabapentin (NEURONTIN) 100 MG capsule Take 100 mg by mouth 3 (three) times daily as needed for pain. 03/07/18 03/07/19 Yes [provider]  hydroxychloroquine (PLAQUENIL) 200 MG tablet Take 200 mg by mouth 2 (two) times daily.  07/15/19 Yes [provider]  hydrOXYzine (VISTARIL) 25 MG capsule Take 25 mg by mouth 4 (four) times daily as needed for itching. 12/05/17  Yes [provider]  ketotifen (ZADITOR) 0.025 % ophthalmic solution Take 3 drops by mouth See admin instructions. Use 3 drops in small amount of water and take up to 4 times daily as needed   Yes [provider]  linaclotide (LINZESS) 145 MCG CAPS capsule Take 145-290 mcg by mouth daily as needed for constipation.   Yes [provider]  LORazepam (ATIVAN) 0.5 MG tablet Place 0.5 mg under the tongue daily as needed for nausea.   Yes [provider]  midodrine (PROAMATINE) 2.5 MG tablet Take 2.5 mg by mouth 3 (three) times daily as needed (hypotension).   Yes [provider]  montelukast (SINGULAIR) 5 MG chewable tablet Chew 10 mg by mouth every evening.   Yes [provider]  omalizumab Arvid Right) 150 MG injection Inject 300 mg into the skin every 21 ( twenty-one) days.   Yes [provider]  propranolol (INDERAL) 10 MG tablet Take 10 mg by mouth See admin instructions. Take 1 tablet (10MG ) every 8 hours and as needed for heart rate >140 03/13/18  Yes [provider]  sodium chloride 0.9 % nebulizer solution Inhale 3 mLs  into the lungs every 6 (six) hours as needed for cough. 12/31/17 12/31/18 Yes [provider]  sucralfate (CARAFATE) 1 g tablet Take 1 g by mouth 4 (four) times daily as needed.   Yes [provider]  tapentadol HCl (NUCYNTA) 75 MG tablet Take 75 mg by mouth every 4 (four) hours as needed for pain. 06/12/18  Yes [provider]  Tapentadol HCl 150 MG TB12 Take 150 mg by mouth every 12 (twelve) hours. 06/05/18 08/04/18 Yes [provider]  tiZANidine (ZANAFLEX) 4 MG tablet Take 2-4 mg by mouth 3 (three) times daily as needed for muscle spasms. 03/22/18 03/22/19 Yes [provider]  predniSONE (STERAPRED UNI-PAK 21 TAB) 10 MG (21) TBPK tablet Take 6 pills on day one then decrease by 1 pill each day Patient not taking: Reported on 07/16/2018 06/30/18   Versie Starks, PA-C      VITAL SIGNS:  Blood pressure (!) 141/87, pulse (!) 101, temperature 97.9 F (36.6 C), temperature source Oral, resp. rate 20, height 5\' 6"  (1.676 m), weight  95.2 kg, SpO2 96 %.  PHYSICAL EXAMINATION:  GENERAL:  32 y.o.-year-old patient lying in the bed with no acute distress.  EYES: Pupils equal, round, reactive to light and accommodation. No scleral icterus. Extraocular muscles intact.  HEENT: Head atraumatic, normocephalic. Oropharynx and nasopharynx clear.  NECK:  Supple, no jugular venous distention. No thyroid enlargement, no tenderness.  LUNGS: Normal breath sounds bilaterally, no wheezing, rales,rhonchi or crepitation. No use of accessory muscles of respiration.  CARDIOVASCULAR: S1, S2 tachycardic no murmurs, rubs, or gallops.  ABDOMEN: Soft, nontender, nondistended. Bowel sounds present. No organomegaly or mass.  EXTREMITIES: No pedal edema, cyanosis, or clubbing.  NEUROLOGIC: Cranial nerves II through XII are intact. Muscle strength 5/5 in all extremities. Sensation intact. Gait not checked.  PSYCHIATRIC: The patient is alert and oriented x 3.  SKIN: Not see any hives.  No  lip swelling.  No wheezing also.  LABORATORY PANEL:   CBC Recent Labs  Lab 07/17/18 0447  WBC 8.3  HGB 14.6  HCT 42.6  PLT 254   ------------------------------------------------------------------------------------------------------------------  Chemistries  Recent Labs  Lab 07/17/18 0447  NA 135  K 4.1  CL 102  CO2 21*  GLUCOSE 148*  BUN 15  CREATININE 0.96  CALCIUM 9.2  AST 35  ALT 47*  ALKPHOS 43  BILITOT 0.6   ------------------------------------------------------------------------------------------------------------------  Cardiac Enzymes No results for input(s): TROPONINI in the last 168 hours. ------------------------------------------------------------------------------------------------------------------  RADIOLOGY:  Dg Chest 2 View  Result Date: 07/17/2018 CLINICAL DATA:  Acute onset of fever. Allergic reaction. EXAM: CHEST - 2 VIEW COMPARISON:  None. FINDINGS: The lungs are well-aerated. Minimal bibasilar atelectasis is noted. There is no evidence of pleural effusion or pneumothorax. The heart is normal in size; the mediastinal contour is within normal limits. No acute osseous abnormalities are seen. Cervical spinal fusion hardware is noted. IMPRESSION: Minimal bibasilar atelectasis noted; lungs otherwise clear. Electronically Signed   By: Garald Balding M.D.   On: 07/17/2018 04:46    EKG:   Orders placed or performed during the hospital encounter of 06/08/18  . ED EKG  . ED EKG  . EKG 12-Lead  . EKG 12-Lead  Sinus tachycardia up to 140 bpm.  IMPRESSION AND PLAN:  32 year old female patient with history of Ehlers-Danlos syndrome, p.o. TS, mast cell disorder followed by [facility] immunology comes in because generalized itching, feeling of flushing.  Patient was feeling poorly day before yesterday with abdominal cramps, seen in the emergency room yesterday and was given Decadron, now she still has feeling of itching all over the body inside even though I do  not see any hives, lip swelling.  #1 idiopathic urticaria flareup: Admit to medical service under observation, start high-dose IV steroids, Benadryl, patient already received multiple doses of Benadryl in the also IV fluids in the emergency room.  Continue IV steroids, Benadryl, see how she feels today.  Continue Xolair, Singulair, Other medicines that she takes for this problem. 2.  History of Ehlers-Danlos syndrome, followed by [facility] rheumatology, patient is on Plaquenil, continue that. 3.  History of low back pain with radicular symptoms followed by [facility] pain clinic status post facet injection, patient is on Neurontin. 4.  History of tachycardia, patient is on propranolol 10 mg every 8 hours as needed for heart rate up to more than 140.  Started, discontinue Cardizem that I started after prophylactics affect. 5.  Chronic back pain, patient states that she takes Nucynta 150 mg p.o. twice daily. 6.  History of IBS, patient is on  Linzess.  Possible discharge home tomorrow. All the records are reviewed and case discussed with ED provider. Management plans discussed with the patient, family and they are in agreement.  CODE STATUS: Full code  TOTAL TIME TAKING CARE OF THIS PATIENT: 55 minutes.    Epifanio Lesches M.D on 07/17/2018 at 1:16 PM  Between 7am to 6pm - Pager - [phone]  After 6pm go to [email or web address] - password [email or web address] [place] [facility]  Office  [phone]  CC: Primary care physician; Illene Bolus, MD  Note: This dictation was prepared with Dragon dictation along with smaller phrase technology. Any transcriptional errors that result from this process are unintentional.

## 2018-07-18 LAB — GLUCOSE, CAPILLARY: GLUCOSE-CAPILLARY: 171 mg/dL — AB (ref 70–99)

## 2018-07-18 LAB — BASIC METABOLIC PANEL
Anion gap: 7 (ref 5–15)
BUN: 14 mg/dL (ref 6–20)
CO2: 19 mmol/L — ABNORMAL LOW (ref 22–32)
Calcium: 8.9 mg/dL (ref 8.9–10.3)
Chloride: 112 mmol/L — ABNORMAL HIGH (ref 98–111)
Creatinine, Ser: 0.91 mg/dL (ref 0.44–1.00)
GFR calc non Af Amer: >60 mL/min (ref >60)
Glucose, Bld: 158 mg/dL — ABNORMAL HIGH (ref 70–99)
Potassium: 4.3 mmol/L (ref 3.5–5.1)
Sodium: 138 mmol/L (ref 135–145)

## 2018-07-18 LAB — CBC
HCT: 37.1 % (ref 36.0–46.0)
Hemoglobin: 12.7 g/dL (ref 12.0–15.0)
MCH: 29.1 pg (ref 26.0–34.0)
MCHC: 34.2 g/dL (ref 30.0–36.0)
MCV: 84.9 fL (ref 80.0–100.0)
Platelets: 228 10*3/uL (ref 150–400)
RBC: 4.37 MIL/uL (ref 3.87–5.11)
RDW: 12.7 % (ref 11.5–15.5)
WBC: 13.3 10*3/uL — ABNORMAL HIGH (ref 4.0–10.5)
nRBC: 0 % (ref 0.0–0.2)

## 2018-07-18 MED ORDER — LORAZEPAM 2 MG/ML IJ SOLN
1.0000 mg | Freq: Four times a day (QID) | INTRAMUSCULAR | Status: DC | PRN
  Administered 2018-07-18: 1 mg via INTRAVENOUS
  Filled 2018-07-18: qty 1

## 2018-07-18 MED ORDER — ENOXAPARIN SODIUM 80 MG/0.8ML ~~LOC~~ SOLN
80.0000 mg | Freq: Two times a day (BID) | SUBCUTANEOUS | Status: DC
  Administered 2018-07-18: 80 mg via SUBCUTANEOUS
  Filled 2018-07-18 (×2): qty 0.8

## 2018-07-18 MED ORDER — PROPRANOLOL HCL 10 MG PO TABS
10.0000 mg | ORAL_TABLET | Freq: Once | ORAL | Status: AC
  Administered 2018-07-18: 10 mg via ORAL
  Filled 2018-07-18: qty 1

## 2018-07-18 MED ORDER — IPRATROPIUM-ALBUTEROL 0.5-2.5 (3) MG/3ML IN SOLN: 3.0000 mL | Freq: Two times a day (BID) | RESPIRATORY_TRACT | Status: DC

## 2018-07-18 MED ORDER — PREDNISONE 10 MG PO TABS: 10.0000 mg | ORAL_TABLET | Freq: Every day | ORAL | 0 refills | Status: AC

## 2018-07-18 NOTE — Progress Notes (Signed)
Patient complained of increased itching and feeling increased warmth. Patient's face became bright red. Notified MD and he ordered Vistaril IM and Pepcid IVPB. Patient refused Vistaril IM but has received IVPB. Hydrocortisone applied. Patient now states [name] is cooler and face is back to original color. No complaints at this time.

## 2018-07-18 NOTE — Progress Notes (Signed)
[name]  A and O x 4. VSS. Pt tolerating diet well. No complaints of pain or nausea. IV removed intact, prescriptions given. Pt voiced understanding of discharge instructions with no further questions. Pt discharged via wheelchair with NT. Pt wants to wait downstairs for her ride. NT will be take down in a wheelchair to the visitors entrance.    Allergies as of 07/18/2018      Reactions   Compazine [prochlorperazine]    Morphine And Related    Phenergan [promethazine]    Reglan [metoclopramide]    Vicodin [hydrocodone-acetaminophen]       Medication List    STOP taking these medications   predniSONE 10 MG (21) Tbpk tablet Commonly known as:  STERAPRED UNI-PAK 21 TAB Replaced by:  predniSONE 10 MG tablet     TAKE these medications   albuterol 2 MG/5ML syrup Commonly known as:  PROVENTIL,VENTOLIN Take 2 mg by mouth every 6 (six) hours as needed.   cloNIDine 0.1 MG tablet Commonly known as:  CATAPRES Take 0.1 mg by mouth at bedtime as needed for sleep.   cromolyn 100 MG/5ML solution Commonly known as:  GASTROCROM Take 100 mg by mouth 4 (four) times daily -  with meals and at bedtime.   cyanocobalamin 1000 MCG/ML injection Commonly known as:  (VITAMIN B-12) Inject 1,000 mcg into the muscle every 30 (thirty) days.   dronabinol 10 MG capsule Commonly known as:  MARINOL Take 10 mg by mouth 2 (two) times daily before a meal.   enoxaparin 80 MG/0.8ML injection Commonly known as:  LOVENOX Inject 80 mg into the skin every 12 (twelve) hours.   EPINEPHrine 0.3 mg/0.3 mL Soaj injection Commonly known as:  EPI-PEN Inject 0.3 mg into the muscle as needed for anaphylaxis.   esomeprazole 40 MG capsule Commonly known as:  NEXIUM Take 40 mg by mouth daily before breakfast.   fexofenadine 180 MG tablet Commonly known as:  ALLEGRA Take 180 mg by mouth daily as needed for allergies.   FLOVENT HFA 44 MCG/ACT inhaler Generic drug:  fluticasone Inhale 2 puffs into the lungs  2 (two) times daily.   gabapentin 100 MG capsule Commonly known as:  NEURONTIN Take 100 mg by mouth 3 (three) times daily as needed for pain.   hydroxychloroquine 200 MG tablet Commonly known as:  PLAQUENIL Take 200 mg by mouth 2 (two) times daily.   hydrOXYzine 25 MG capsule Commonly known as:  VISTARIL Take 25 mg by mouth 4 (four) times daily as needed for itching.   ketotifen 0.025 % ophthalmic solution Commonly known as:  ZADITOR Take 3 drops by mouth See admin instructions. Use 3 drops in small amount of water and take up to 4 times daily as needed   linaclotide 145 MCG Caps capsule Commonly known as:  LINZESS Take 145-290 mcg by mouth daily as needed for constipation.   LORazepam 0.5 MG tablet Commonly known as:  ATIVAN Place 0.5 mg under the tongue daily as needed for nausea.   midodrine 2.5 MG tablet Commonly known as:  PROAMATINE Take 2.5 mg by mouth 3 (three) times daily as needed (hypotension).   montelukast 5 MG chewable tablet Commonly known as:  SINGULAIR Chew 10 mg by mouth every evening.   omalizumab 150 MG injection Commonly known as:  XOLAIR Inject 300 mg into the skin every 21 ( twenty-one) days.   predniSONE 10 MG tablet Commonly known as:  DELTASONE Take 1 tablet (10 mg total) by mouth daily with  breakfast. 60 mg PO (ORAL) x 2 days 50 mg PO (ORAL)  x 2 days 40 mg PO (ORAL)  x 2 days 30 mg PO  (ORAL)  x 2 days 20 mg PO  (ORAL) x 2 days 10 mg PO  (ORAL) x 2 days then stop Replaces:  predniSONE 10 MG (21) Tbpk tablet   propranolol 10 MG tablet Commonly known as:  INDERAL Take 10 mg by mouth See admin instructions. Take 1 tablet (10MG ) every 8 hours and as needed for heart rate >140   sodium chloride 0.9 % nebulizer solution Inhale 3 mLs into the lungs every 6 (six) hours as needed for cough.   sucralfate 1 g tablet Commonly known as:  CARAFATE Take 1 g by mouth 4 (four) times daily as needed.   Tapentadol HCl 150 MG Tb12 Take 150 mg by  mouth every 12 (twelve) hours.   tapentadol HCl 75 MG tablet Commonly known as:  NUCYNTA Take 75 mg by mouth every 4 (four) hours as needed for pain.   tiZANidine 4 MG tablet Commonly known as:  ZANAFLEX Take 2-4 mg by mouth 3 (three) times daily as needed for muscle spasms.       Vitals:   07/18/18 0643 07/18/18 0753  BP:    Pulse: 89   Resp:    Temp:    SpO2:  96%    Francesco Sor

## 2018-07-18 NOTE — Discharge Summary (Signed)
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 Asthma   . Collagen vascular disease ([place])   . Hypertension   . Renal insufficiency     HOSPITAL COURSE:   32 year old female with a history of POTS, systemic mastocytosis who presented to the emergency room due to itching sensation all over her body.  1.  Idiopathic urticaria flareup/mastocytosis: Patient symptoms have improved and she will be discharged on outpatient regimen.  2.  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  History of chronic lower back pain  4.  History of tachycardia: Continue propanolol   DISCHARGE CONDITIONS AND DIET:   Stable Regular diet  CONSULTS OBTAINED:    DRUG ALLERGIES:   Allergies  Allergen Reactions  . Compazine [Prochlorperazine]   . Morphine And Related   . Phenergan [Promethazine]   . Reglan [Metoclopramide]   . Vicodin [Hydrocodone-Acetaminophen]     DISCHARGE MEDICATIONS:   Allergies as of 07/18/2018      Reactions   Compazine [prochlorperazine]    Morphine And Related    Phenergan [promethazine]    Reglan [metoclopramide]    Vicodin [hydrocodone-acetaminophen]       Medication List    STOP taking these medications   predniSONE 10 MG (21) Tbpk tablet Commonly known as:  STERAPRED UNI-PAK 21 TAB Replaced by:  predniSONE 10 MG tablet      TAKE these medications   albuterol 2 MG/5ML syrup Commonly known as:  PROVENTIL,VENTOLIN Take 2 mg by mouth every 6 (six) hours as needed.   cloNIDine 0.1 MG tablet Commonly known as:  CATAPRES Take 0.1 mg by mouth at bedtime as needed for sleep.   cromolyn 100 MG/5ML solution Commonly known as:  GASTROCROM Take 100 mg by mouth 4 (four) times daily -  with meals and at bedtime.   cyanocobalamin 1000 MCG/ML injection Commonly known as:  (VITAMIN B-12) Inject 1,000 mcg into the muscle every 30 (thirty) days.   dronabinol 10 MG capsule Commonly known as:  MARINOL Take 10 mg by mouth 2 (two) times daily before a meal.   enoxaparin 80 MG/0.8ML injection Commonly known as:  LOVENOX Inject 80 mg into the skin every 12 (twelve) hours.   EPINEPHrine 0.3 mg/0.3 mL Soaj injection Commonly known as:  EPI-PEN Inject 0.3 mg into the muscle as needed for anaphylaxis.   esomeprazole 40 MG capsule Commonly known as:  NEXIUM Take 40 mg by mouth daily before breakfast.   fexofenadine 180 MG tablet Commonly known as:  ALLEGRA Take 180 mg by mouth daily as needed for allergies.   FLOVENT HFA 44 MCG/ACT inhaler Generic drug:  fluticasone Inhale 2 puffs into the lungs 2 (two) times daily.   gabapentin 100 MG capsule Commonly known as:  NEURONTIN Take 100 mg by mouth 3 (three) times daily as needed for pain.   hydroxychloroquine 200 MG tablet Commonly known as:  PLAQUENIL Take 200 mg by  mouth 2 (two) times daily.   hydrOXYzine 25 MG capsule Commonly known as:  VISTARIL Take 25 mg by mouth 4 (four) times daily as needed for itching.   ketotifen 0.025 % ophthalmic solution Commonly known as:  ZADITOR Take 3 drops by mouth See admin instructions. Use 3 drops in small amount of water and take up to 4 times daily as needed   linaclotide 145 MCG Caps capsule Commonly known as:  LINZESS Take 145-290 mcg by mouth daily as needed for constipation.   LORazepam 0.5 MG tablet Commonly  known as:  ATIVAN Place 0.5 mg under the tongue daily as needed for nausea.   midodrine 2.5 MG tablet Commonly known as:  PROAMATINE Take 2.5 mg by mouth 3 (three) times daily as needed (hypotension).   montelukast 5 MG chewable tablet Commonly known as:  SINGULAIR Chew 10 mg by mouth every evening.   omalizumab 150 MG injection Commonly known as:  XOLAIR Inject 300 mg into the skin every 21 ( twenty-one) days.   predniSONE 10 MG tablet Commonly known as:  DELTASONE Take 1 tablet (10 mg total) by mouth daily with breakfast. 60 mg PO (ORAL) x 2 days 50 mg PO (ORAL)  x 2 days 40 mg PO (ORAL)  x 2 days 30 mg PO  (ORAL)  x 2 days 20 mg PO  (ORAL) x 2 days 10 mg PO  (ORAL) x 2 days then stop Replaces:  predniSONE 10 MG (21) Tbpk tablet   propranolol 10 MG tablet Commonly known as:  INDERAL Take 10 mg by mouth See admin instructions. Take 1 tablet (10MG ) every 8 hours and as needed for heart rate >140   sodium chloride 0.9 % nebulizer solution Inhale 3 mLs into the lungs every 6 (six) hours as needed for cough.   sucralfate 1 g tablet Commonly known as:  CARAFATE Take 1 g by mouth 4 (four) times daily as needed.   Tapentadol HCl 150 MG Tb12 Take 150 mg by mouth every 12 (twelve) hours.   tapentadol HCl 75 MG tablet Commonly known as:  NUCYNTA Take 75 mg by mouth every 4 (four) hours as needed for pain.   tiZANidine 4 MG tablet Commonly known as:  ZANAFLEX Take 2-4 mg by mouth 3 (three) times daily as needed for muscle spasms.         Today   CHIEF COMPLAINT:  Itching improved   VITAL SIGNS:  Blood pressure 112/81, pulse 89, temperature 98.6 F (37 C), temperature source Oral, resp. rate 20, height 5\' 6"  (1.676 m), weight 95.2 kg, SpO2 96 %.   REVIEW OF SYSTEMS:  Review of Systems  Constitutional: Negative.  Negative for chills, fever and malaise/fatigue.  HENT: Negative.  Negative for ear discharge, ear pain, hearing loss, nosebleeds and sore throat.    Eyes: Negative.  Negative for blurred vision and pain.  Respiratory: Negative.  Negative for cough, hemoptysis, shortness of breath and wheezing.   Cardiovascular: Negative.  Negative for chest pain, palpitations and leg swelling.  Gastrointestinal: Negative.  Negative for abdominal pain, blood in stool, diarrhea, nausea and vomiting.  Genitourinary: Negative.  Negative for dysuria.  Musculoskeletal: Negative.  Negative for back pain.  Skin: Negative.   Neurological: Negative for dizziness, tremors, speech change, focal weakness, seizures and headaches.  Endo/Heme/Allergies: Negative.  Does not bruise/bleed easily.  Psychiatric/Behavioral: Negative.  Negative for depression, hallucinations and suicidal ideas.     PHYSICAL EXAMINATION:  GENERAL:  32 y.o.-year-old patient lying in the  bed with no acute distress.  NECK:  Supple, no jugular venous distention. No thyroid enlargement, no tenderness.  LUNGS: Normal breath sounds bilaterally, no wheezing, rales,rhonchi  No use of accessory muscles of respiration.  CARDIOVASCULAR: S1, S2 normal. No murmurs, rubs, or gallops.  ABDOMEN: Soft, non-tender, non-distended. Bowel sounds present. No organomegaly or mass.  EXTREMITIES: No pedal edema, cyanosis, or clubbing.  PSYCHIATRIC: The patient is alert and oriented x 3.  SKIN: No obvious rash, lesion, or ulcer.   DATA REVIEW:   CBC Recent Labs  Lab 07/18/18 0459  WBC 13.3*  HGB 12.7  HCT 37.1  PLT 228    Chemistries  Recent Labs  Lab 07/17/18 0447 07/18/18 0459  NA 135 138  K 4.1 4.3  CL 102 112*  CO2 21* 19*  GLUCOSE 148* 158*  BUN 15 14  CREATININE 0.96 0.91  CALCIUM 9.2 8.9  AST 35  --   ALT 47*  --   ALKPHOS 43  --   BILITOT 0.6  --     Cardiac Enzymes No results for input(s): TROPONINI in the last 168 hours.  Microbiology Results  @MICRORSLT48 @  RADIOLOGY:  Dg Chest 2 View  Result Date: 07/17/2018 CLINICAL DATA:  Acute onset of fever. Allergic reaction.  EXAM: CHEST - 2 VIEW COMPARISON:  None. FINDINGS: The lungs are well-aerated. Minimal bibasilar atelectasis is noted. There is no evidence of pleural effusion or pneumothorax. The heart is normal in size; the mediastinal contour is within normal limits. No acute osseous abnormalities are seen. Cervical spinal fusion hardware is noted. IMPRESSION: Minimal bibasilar atelectasis noted; lungs otherwise clear. Electronically Signed   By: Garald Balding M.D.   On: 07/17/2018 04:46      Allergies as of 07/18/2018      Reactions   Compazine [prochlorperazine]    Morphine And Related    Phenergan [promethazine]    Reglan [metoclopramide]    Vicodin [hydrocodone-acetaminophen]       Medication List    STOP taking these medications   predniSONE 10 MG (21) Tbpk tablet Commonly known as:  STERAPRED UNI-PAK 21 TAB Replaced by:  predniSONE 10 MG tablet     TAKE these medications   albuterol 2 MG/5ML syrup Commonly known as:  PROVENTIL,VENTOLIN Take 2 mg by mouth every 6 (six) hours as needed.   cloNIDine 0.1 MG tablet Commonly known as:  CATAPRES Take 0.1 mg by mouth at bedtime as needed for sleep.   cromolyn 100 MG/5ML solution Commonly known as:  GASTROCROM Take 100 mg by mouth 4 (four) times daily -  with meals and at bedtime.   cyanocobalamin 1000 MCG/ML injection Commonly known as:  (VITAMIN B-12) Inject 1,000 mcg into the muscle every 30 (thirty) days.   dronabinol 10 MG capsule Commonly known as:  MARINOL Take 10 mg by mouth 2 (two) times daily before a meal.   enoxaparin 80 MG/0.8ML injection Commonly known as:  LOVENOX Inject 80 mg into the skin every 12 (twelve) hours.   EPINEPHrine 0.3 mg/0.3 mL Soaj injection Commonly known as:  EPI-PEN Inject 0.3 mg into the muscle as needed for anaphylaxis.   esomeprazole 40 MG capsule Commonly known as:  NEXIUM Take 40 mg by mouth daily before breakfast.   fexofenadine 180 MG tablet Commonly known as:  ALLEGRA Take 180 mg by  mouth daily as needed for allergies.   FLOVENT HFA 44 MCG/ACT inhaler Generic drug:  fluticasone Inhale 2 puffs into the lungs 2 (two) times daily.  gabapentin 100 MG capsule Commonly known as:  NEURONTIN Take 100 mg by mouth 3 (three) times daily as needed for pain.   hydroxychloroquine 200 MG tablet Commonly known as:  PLAQUENIL Take 200 mg by mouth 2 (two) times daily.   hydrOXYzine 25 MG capsule Commonly known as:  VISTARIL Take 25 mg by mouth 4 (four) times daily as needed for itching.   ketotifen 0.025 % ophthalmic solution Commonly known as:  ZADITOR Take 3 drops by mouth See admin instructions. Use 3 drops in small amount of water and take up to 4 times daily as needed   linaclotide 145 MCG Caps capsule Commonly known as:  LINZESS Take 145-290 mcg by mouth daily as needed for constipation.   LORazepam 0.5 MG tablet Commonly known as:  ATIVAN Place 0.5 mg under the tongue daily as needed for nausea.   midodrine 2.5 MG tablet Commonly known as:  PROAMATINE Take 2.5 mg by mouth 3 (three) times daily as needed (hypotension).   montelukast 5 MG chewable tablet Commonly known as:  SINGULAIR Chew 10 mg by mouth every evening.   omalizumab 150 MG injection Commonly known as:  XOLAIR Inject 300 mg into the skin every 21 ( twenty-one) days.   predniSONE 10 MG tablet Commonly known as:  DELTASONE Take 1 tablet (10 mg total) by mouth daily with breakfast. 60 mg PO (ORAL) x 2 days 50 mg PO (ORAL)  x 2 days 40 mg PO (ORAL)  x 2 days 30 mg PO  (ORAL)  x 2 days 20 mg PO  (ORAL) x 2 days 10 mg PO  (ORAL) x 2 days then stop Replaces:  predniSONE 10 MG (21) Tbpk tablet   propranolol 10 MG tablet Commonly known as:  INDERAL Take 10 mg by mouth See admin instructions. Take 1 tablet (10MG ) every 8 hours and as needed for heart rate >140   sodium chloride 0.9 % nebulizer solution Inhale 3 mLs into the lungs every 6 (six) hours as needed for cough.   sucralfate 1 g  tablet Commonly known as:  CARAFATE Take 1 g by mouth 4 (four) times daily as needed.   Tapentadol HCl 150 MG Tb12 Take 150 mg by mouth every 12 (twelve) hours.   tapentadol HCl 75 MG tablet Commonly known as:  NUCYNTA Take 75 mg by mouth every 4 (four) hours as needed for pain.   tiZANidine 4 MG tablet Commonly known as:  ZANAFLEX Take 2-4 mg by mouth 3 (three) times daily as needed for muscle spasms.          Management plans discussed with the patient and she is in agreement. Stable for discharge   Patient should follow up with pcp  CODE STATUS:     Code Status Orders  (From admission, onward)         Start     Ordered   07/17/18 1055  Full code  Continuous     07/17/18 1057        Code Status History    This patient has a current code status but no historical code status.      TOTAL TIME TAKING CARE OF THIS PATIENT: 38 minutes.    Note: This dictation was prepared with Dragon dictation along with smaller phrase technology. Any transcriptional errors that result from this process are unintentional.  Dasean Brow M.D on 07/18/2018 at 12:06 PM  Between 7am to 6pm - Pager - [phone] After 6pm go to [email or web address] - password [email or web address]  [facility]  Office  [phone]  CC: Primary care physician; Illene Bolus, MD

## 2018-07-18 NOTE — Progress Notes (Signed)
Notified MD of patient's heart rate in 140s. MD ordered to give daily propranolol. Patient also anxious. Propranolol and Ativan IV given. Heart rate now in 90s and patient is resting quietly in bed with eyes closed.

## 2018-07-18 NOTE — Care Management Obs Status (Signed)
[place] NOTIFICATION   Patient Details  Name: [name] MRN: [id number] Date of Birth: 11/21/1985   Medicare Observation Status Notification Given:  No  patient discharged before CM could provide notice.  Discharge order placed in < 24hr of being placed on observation   Katrina Stack, RN 07/18/2018, 12:24 PM

## 2018-07-18 NOTE — Progress Notes (Signed)
   07/18/18 1048  Clinical Encounter Type  Visited With Patient not available  Visit Type Follow-up;Spiritual support  Referral From Nurse  Consult/Referral To Chaplain  Spiritual Encounters  Spiritual Needs Other (Comment)   [place] received an OR 07/17/2018 to educate and complete an AD. The patient asked him to come back on 07/18/2018. The OR was passed along to me and I was asked to follow up. When I attempted to visit with the patient she was already discharged. The OR will be closed out.

## 2018-07-18 NOTE — Progress Notes (Signed)
Patient states she takes lovenox 80 mg q12 hours at home and is uncomfortable taking the weight based dose of 95 mg here.  It was explained to patient that full anticoagulation is weight based, and patient persists in request to change order.  Order changed to match home meds dose.  Jacqulyn Bath [facility] 07/18/2018, 1:24 AM  Note:  This document was prepared using Dragon voice recognition software and may include unintentional dictation errors.

## 2018-07-19 LAB — TRYPTASE: Tryptase: 19.2 ug/L — ABNORMAL HIGH (ref 2.2–13.2)

## 2018-07-19 LAB — HIV ANTIBODY (ROUTINE TESTING W REFLEX): HIV Screen 4th Generation wRfx: NONREACTIVE

## 2018-07-28 DIAGNOSIS — R509 Fever, unspecified: Principal | ICD-10-CM

## 2018-07-29 ENCOUNTER — Emergency Department: Admit: 2018-07-29 | Discharge: 2018-07-29 | Disposition: A | Discharge status: Home / Self Care | Payer: MEDICARE

## 2018-07-29 ENCOUNTER — Ambulatory Visit: Admit: 2018-07-29 | Discharge: 2018-07-29 | Disposition: A | Discharge status: Home / Self Care | Payer: MEDICARE

## 2018-08-12 ENCOUNTER — Ambulatory Visit
Admit: 2018-08-12 | Discharge: 2018-08-13 | Disposition: A | Discharge status: Home / Self Care | Payer: MEDICARE | Attending: Emergency Medicine

## 2018-08-19 NOTE — ED Provider Notes (Signed)
Addendum to visit dated 07/17/2018  CRITICAL CARE Performed by: Paulette Blanch   Total critical care time: 30 minutes  Critical care time was exclusive of separately billable procedures and treating other patients.  Critical care was necessary to treat or prevent imminent or life-threatening deterioration.  Critical care was time spent personally by me on the following activities: development of treatment plan with patient and/or surrogate as well as nursing, discussions with consultants, evaluation of patient's response to treatment, examination of patient, obtaining history from patient or surrogate, ordering and performing treatments and interventions, ordering and review of laboratory studies, ordering and review of radiographic studies, pulse oximetry and re-evaluation of patient's condition.   Paulette Blanch, MD 08/19/18 [phone]

## 2018-09-12 MED ORDER — ALBUTEROL SULFATE CONCENTRATE 2.5 MG/0.5 ML SOLUTION FOR NEBULIZATION
RESPIRATORY_TRACT | 12 refills | 0.00000 days | Status: CP | PRN
Start: 2018-09-12 — End: 2019-09-12

## 2018-10-10 DIAGNOSIS — D8940 Mast cell activation, unspecified: Principal | ICD-10-CM

## 2018-10-10 DIAGNOSIS — R748 Abnormal levels of other serum enzymes: Principal | ICD-10-CM

## 2018-10-10 DIAGNOSIS — R000 Tachycardia, unspecified: Principal | ICD-10-CM

## 2018-10-10 DIAGNOSIS — L501 Idiopathic urticaria: Principal | ICD-10-CM

## 2018-10-10 DIAGNOSIS — I951 Orthostatic hypotension: Principal | ICD-10-CM

## 2018-10-16 MED ORDER — HYDROXYZINE PAMOATE 25 MG CAPSULE
ORAL_CAPSULE | 11 refills | 0 days
Start: 2018-10-16 — End: ?

## 2018-11-02 ENCOUNTER — Ambulatory Visit: Admit: 2018-11-02 | Discharge: 2018-11-03 | Disposition: A | Discharge status: Home / Self Care | Payer: MEDICARE

## 2018-11-02 ENCOUNTER — Emergency Department: Admit: 2018-11-02 | Discharge: 2018-11-03 | Disposition: A | Discharge status: Home / Self Care | Payer: MEDICARE

## 2018-11-02 DIAGNOSIS — R32 Unspecified urinary incontinence: Secondary | ICD-10-CM

## 2018-11-02 DIAGNOSIS — G8929 Other chronic pain: Secondary | ICD-10-CM

## 2018-11-02 DIAGNOSIS — M549 Dorsalgia, unspecified: Principal | ICD-10-CM

## 2018-12-02 ENCOUNTER — Other Ambulatory Visit: Payer: Self-pay

## 2018-12-02 ENCOUNTER — Encounter: Payer: Self-pay | Admitting: Obstetrics & Gynecology

## 2018-12-02 ENCOUNTER — Ambulatory Visit (INDEPENDENT_AMBULATORY_CARE_PROVIDER_SITE_OTHER): Payer: Medicare Other | Admitting: Obstetrics & Gynecology

## 2018-12-02 VITALS — BP 173/123 | HR 72 | Wt 230.0 lb

## 2018-12-02 DIAGNOSIS — Z30432 Encounter for removal of intrauterine contraceptive device: Secondary | ICD-10-CM

## 2018-12-02 DIAGNOSIS — B379 Candidiasis, unspecified: Secondary | ICD-10-CM

## 2018-12-02 MED ORDER — FLUCONAZOLE 150 MG PO TABS: 150.0000 mg | ORAL_TABLET | ORAL | 3 refills | Status: AC

## 2018-12-02 NOTE — Progress Notes (Signed)
Did not take blood pressure medication today  [facility] IUD 2015 Last Pap was normal  2018

## 2018-12-02 NOTE — Progress Notes (Signed)
    GYNECOLOGY OFFICE PROCEDURE NOTE  [name] is a 33 y.o. F here for Mirena IUD removal, placed in 08/2013 at [facility].  Has had several problems with this modality, does not want to use another modality other than condoms.  No GYN concerns.  Last pap smear was in 2018 and was normal.  IUD Removal  Patient identified, informed consent performed, consent signed.  Patient was in the dorsal lithotomy position, normal external genitalia was noted.  A speculum was placed in the patient's vagina, normal discharge was noted, no lesions. The cervix was visualized, no lesions, no abnormal discharge.  The strings of the IUD were grasped and pulled using ring forceps. The IUD was removed in its entirety.  Patient tolerated the procedure well.    Patient will use condoms for contraception, and she was given a lot of condoms from the office today. Reported being on antibiotics, has rebound yeast infections and wants Diflucan prophylaxis which was given to her.  Routine preventative health maintenance measures emphasized.   Verita Schneiders, MD, [place] for [employer], [place]

## 2018-12-09 ENCOUNTER — Telehealth
Admit: 2018-12-09 | Discharge: 2018-12-10 | Payer: MEDICARE | Attending: Physical Medicine & Rehabilitation | Primary: Physical Medicine & Rehabilitation

## 2018-12-09 DIAGNOSIS — G8929 Other chronic pain: Secondary | ICD-10-CM

## 2018-12-09 DIAGNOSIS — M549 Dorsalgia, unspecified: Secondary | ICD-10-CM

## 2018-12-09 DIAGNOSIS — N319 Neuromuscular dysfunction of bladder, unspecified: Secondary | ICD-10-CM

## 2018-12-09 DIAGNOSIS — M5441 Lumbago with sciatica, right side: Secondary | ICD-10-CM

## 2018-12-09 DIAGNOSIS — R29898 Other symptoms and signs involving the musculoskeletal system: Secondary | ICD-10-CM

## 2018-12-09 DIAGNOSIS — M533 Sacrococcygeal disorders, not elsewhere classified: Principal | ICD-10-CM

## 2018-12-09 DIAGNOSIS — M357 Hypermobility syndrome: Secondary | ICD-10-CM

## 2018-12-09 DIAGNOSIS — D8940 Mast cell activation, unspecified: Secondary | ICD-10-CM

## 2018-12-09 DIAGNOSIS — M545 Low back pain: Secondary | ICD-10-CM

## 2018-12-13 MED ORDER — HYDROXYCHLOROQUINE 200 MG TABLET
ORAL_TABLET | Freq: Two times a day (BID) | ORAL | 11 refills | 0.00000 days | Status: CP
Start: 2018-12-13 — End: 2019-12-13

## 2018-12-17 MED ORDER — VENTOLIN HFA 90 MCG/ACTUATION AEROSOL INHALER
6 refills | 0 days | Status: CP
Start: 2018-12-17 — End: ?

## 2018-12-21 MED ORDER — OMALIZUMAB 150 MG/ML SUBCUTANEOUS SYRINGE
INJECTION | SUBCUTANEOUS | 12 refills | 0.00000 days | Status: CP
Start: 2018-12-21 — End: 2019-01-07

## 2018-12-23 ENCOUNTER — Encounter: Payer: Self-pay | Admitting: Radiology

## 2019-01-07 MED ORDER — OMALIZUMAB 150 MG/ML SUBCUTANEOUS SYRINGE
SUBCUTANEOUS | 12 refills | 21 days | Status: CP
Start: 2019-01-07 — End: ?
  Filled 2019-01-13: qty 2, 21d supply, fill #0

## 2019-01-07 NOTE — Unmapped (Signed)
January 07, 2019 2:01 PM     In response to request from Micki Riley, RN regarding patient's Xolair, I have faxed through Epic a prescription for the patient's Xolair to the [facility] -- this is to be mailed to the patient's home.    Requested Prescriptions     Signed Prescriptions Disp Refills   ??? omalizumab (XOLAIR) 150 mg/mL injection 2 Syringe 12     Sig: Inject 2 mL (300 mg total) under the skin every twenty-one (21) days.     Authorizing Provider: Peggyann Shoals, MD PhD  Assistant Professor of Medicine  Division of Rheumatology, Allergy & Immunology  [place] [facility] [place] at [facility]

## 2019-01-07 NOTE — Unmapped (Signed)
[facility] Medication Referral: No PA required    Medication (Brand/Generic): Xolair 150 mg/mL Injection    Initial Benefits Investigation Claim completed with resulted information below:  No PA required  Patient ABLE to fill at [facility]  Insurance Company:  [employer]  Anticipated Copay: $0.00    As Co-pay is under $25 defined limit, per policy there will be no further investigation of need for financial assistance at this time unless patient requests. This referral has been communicated to the provider and handed off to the [facility] team for further processing and filling of prescribed medication.   ______________________________________________________________________  Please utilize this referral for viewing purposes as it will serve as the central location for all relevant documentation and updates.

## 2019-01-08 NOTE — Unmapped (Signed)
[facility]   Patient Onboarding/Medication Counseling    Sandy Chandler is a 33 y.o. 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  I am speaking to the patient.    Verified patient's date of birth / HIPAA.    Specialty medication(s) to be sent: General Specialty: Xolair      Non-specialty medications/supplies to be sent: sharps container      Medications not needed at this time: none         Xolair Syringes (omalizumab)    Medication & Administration     Dosage: Inject the contents of 2 syringes (300 mg total) under the skin every twenty-one (21) days.    Administration:   Prefilled syringe  ? Gather all supplies needed for injection on a clean, flat working surface: medication syringe(s) removed from packaging, alcohol swab, sharps container, etc.  ? Look at the medication label ??? look for correct medication, correct dose, and check the expiration date  ? Look at the medication ??? the liquid in the syringe should appear clear and colorless to slightly yellow, you may see a few white particles  ? Lay the syringe on a flat surface and allow it to warm up to room temperature for at least 15-30 minutes  ? Select injection site ??? you can use the front of your thigh or your belly (but not the area 2 inches around your belly button); if someone else is giving you the injection you can also use your upper arm in the skin covering your triceps muscle or in the buttocks  ? Prepare injection site ??? wash your hands and clean the skin at the injection site with an alcohol swab and let it air dry, do not touch the injection site again before the injection  ? Pull off the needle safety cap, do not remove until immediately prior to injection  ? Pinch the skin ??? with your hand not holding the syringe pinch up a fold of skin at the injection site using your forefinger and thumb  ? Insert the needle into the fold of skin at about a 45 degree angle ??? it's best to use a quick dart-like motion  ? Push the plunger down slowly as far as it will go until the syringe is empty, if the plunger is not fully depressed the needle shield will not extend to cover the needle when it is removed, hold the syringe in place for a full 5 seconds  ? Check that the syringe is empty and keep pressing down on the plunger while you pull the needle out at the same angle as inserted; after the needle is removed completely from the skin, release the plunger allowing the needle shield to activate and cover the used needle  ? Dispose of the used syringe immediately in your sharps disposal container, do not attempt to recap the needle prior to disposing  ? If you see any blood at the injection site, press a cotton ball or gauze on the site and maintain pressure until the bleeding stops, do not rub the injection site    Adherence/Missed dose instructions: If you miss a dose take as soon as you remember.  Resume the correct dosing schedule.        Goals of Therapy     ? To treat asthma and or chronic idiopathic urticaria    Side Effects & Monitoring Parameters     Commonly reported side effects  ? Headache  ?  Nausea, vomiting   ? Injection site reaction  ? Loss of strength and energy  ? Common cold symptoms, sore throat, stuffy nose  ? Ear pain  ? Painful extremities     The following side effects should be reported to the provider:  ? Signs of cerebrovascular disease (change in strength on one side is greater than the other, trouble speaking or thinking, change in balance or vision changes)  ? Signs of DVT (swelling, warmth, numbness, change in color or pain in extremities)  ? Signs of anaphylaxis (wheezing, chest tightness, swelling of face, lips, tongue or throat)    Monitoring Parameters:   ? Anaphylactic/hypersensitivity reactions (observe patients for 2 hours after the first 3 injections and 30 minutes after subsequent injections or in accordance with individual institution policies and procedures);   ? Baseline serum total IgE; FEV1, peak flow, and/or other pulmonary function tests  ? Monitor for signs of infection    Contraindications, Warnings, & Precautions   [place] Boxed Warning]: Anaphylaxis, including delayed-onset anaphylaxis, has been reported following administration; anaphylaxis may present as bronchospasm, hypotension, syncope, urticaria, and/or angioedema of the throat or tongue. Anaphylaxis has occurred after the first dose and in some cases >1 year after initiation of regular treatment. Due to the risk, patients should be observed closely for an appropriate time period after administration and should receive treatment only under direct medical supervision. Healthcare providers should be prepared to administer appropriate therapy for managing potentially life-threatening anaphylaxis. Patients should be instructed on identifying signs/symptoms of anaphylaxis and to seek immediate care if they arise.      Contraindications  ? Severe hypersensitivity reaction to omalizumab or any component of the formulation    Warnings & Precautions  ? Cardiovascular effects: Cerebrovascular events, including transient ischemic attack and ischemic stroke, have been reported.  ? Eosinophilia and vasculitis: In rare cases, patients may present with systemic eosinophilia, sometimes presenting with clinical features of vasculitis.    ? Fever/arthralgia/rash: Reports of a constellation of symptoms including fever, arthritis or arthralgia, rash, and lymphadenopathy have been reported with post-marketing use.  ? Malignant neoplasms: Have been reported rarely with use in short-term studies; impact of long-term use is not known.  ? Parasitic infections: Use with caution and monitor patients at high risk for parasitic infections; risk of infection may be increased; appropriate duration of continued monitoring following therapy discontinuation has not been established.  ? Corticosteroid therapy: Gradually taper systemic or inhaled corticosteroid therapy; do not discontinue corticosteroids abruptly following initiation of omalizumab therapy. The combined use of omalizumab and corticosteroids in patients with chronic idiopathic urticaria has not been evaluated.  ? Latex: Prefilled syringe: The needle cap may contain natural rubber latex.  ? Appropriate use: Therapy has not been shown to alleviate acute asthma exacerbations; do not use to treat acute bronchospasm, status asthmaticus, or other allergic conditions. Do not use to treat forms of urticaria other than chronic idiopathic urticaria.  ? Dosing/IgE levels: Dosing for asthma is based on body weight and pretreatment total IgE serum levels. IgE levels remain elevated up to 1 year following treatment; therefore, levels taken during treatment or for up to 1 year following treatment cannot and should not be used as a dosage guide. Dosing in chronic idiopathic urticaria is not dependent on serum IgE (free or total) level or body weight.  ? Pregnancy Considerations: Omalizumab is a humanized monoclonal antibody (IgG1). Potential placental transfer of human IgG is dependent upon the IgG subclass and gestational age, generally increasing as  pregnancy progresses.  ? Breastfeeding Considerations: It is not known if omalizumab is present in breast milk; however, IgG is excreted in human milk.  Based on information from the pregnancy exposure registry, an increased risk of adverse events was not observed in breastfed infants of mothers using omalizumab. According to the manufacturer, the decision to breastfeed during therapy should consider the risk of infant exposure, the benefits of breastfeeding to the infant, and benefits of treatment to the mother      Drug/Food Interactions     ? Medication list reviewed in Epic. The patient was instructed to inform the care team before taking any new medications or supplements. No drug interactions identified.     Storage, Handling Precautions, & Disposal   ? Store this medication in the refrigerator.  Do not freeze  ? Store in [employer], protected from light.  ? Do not shake.  ? Dispose of used syringes in a sharps disposal container.      Current Medications (including OTC/herbals), Comorbidities and Allergies     Current Outpatient Medications   Medication Sig Dispense Refill   ??? albuterol (PROVENTIL,VENTOLIN) 2 mg/5 mL syrup Take 5 mL (2 mg total) by mouth every six (6) hours as needed. for up to 30 doses 120 mL 0   ??? albuterol 2.5 mg/0.5 mL nebulizer solution Inhale 0.5 mL (2.5 mg total) by nebulization every four (4) hours as needed for wheezing or shortness of breath (couh). 30 each 12   ??? cromolyn (GASTROCROM) 100 mg/5 mL solution TAKE 5 ML (100 MG TOTAL) BY MOUTH FOUR (4) TIMES A DAY (BEFORE MEALS AND NIGHTLY). 480 mL 11   ??? cyanocobalamin 1,000 mcg/mL injection Inject 1,000 mcg into the muscle.     ??? doxepin (SINEQUAN) 10 mg/mL solution Take 2 mL (20 mg total) by mouth three (3) times a day (at 6am, noon and 6pm).     ??? dronabinol (MARINOL) 10 MG capsule take 1 capsule by mouth twice a day before meals prn  0   ??? enoxaparin (LOVENOX) 80 mg/0.8 mL injection Inject 0.8 mL (80 mg total) under the skin every twelve (12) hours. 60 Syringe 0   ??? EPIPEN 2-PAK 0.3 mg/0.3 mL injection Inject 0.3 mL (0.3 mg total) into the muscle once as needed for anaphylaxis. for up to 1 dose 2 Device 6   ??? esomeprazole (NEXIUM) 40 MG capsule Take 40 mg by mouth every morning before breakfast.     ??? famotidine (PEPCID) 40 MG tablet Take 1 tablet (40 mg total) by mouth Two (2) times a day. 60 tablet 0   ??? fexofenadine (ALLEGRA) 180 MG tablet Take 2 tablets (360 mg total) by mouth Two (2) times a day. 30 tablet 12   ??? fluticasone (FLONASE) 50 mcg/actuation nasal spray 2 sprays by Each Nare route daily. 16 g 12   ??? fluticasone propionate (FLOVENT HFA) 44 mcg/actuation inhaler Inhale 2 puffs Two (2) times a day. With spacer. Please rinse out mouth or brush teeth after use.     ??? HYDROmorphone (DILAUDID) 2 MG tablet Take 1 tablet (2 mg total) by mouth every four (4) hours as needed for pain,moderate (4-6). for up to 5 doses 5 tablet 0   ??? hydroxychloroquine (PLAQUENIL) 200 mg tablet Take 1 tablet (200 mg total) by mouth Two (2) times a day. 60 tablet 11   ??? hydrOXYzine (VISTARIL) 100 MG capsule Take 1 capsule (100 mg total) by mouth nightly. 30 capsule 0   ??? hydrOXYzine (  VISTARIL) 25 MG capsule Take 25 to 50 mg as needed for MCAS flare or as pretreatment before exposure to triggers. 60 capsule 11   ??? ketotifen (ZADITOR) 0.025 % (0.035 %) ophthalmic solution Put 3 drops into a small amount of water or fluid and take by mouth up to 4 times a day as needed. 5 mL 0   ??? levonorgestrel (MIRENA) 20 mcg/24 hours (5 yrs) 52 mg IUD 1 each by Intrauterine route once.     ??? linaclotide (LINZESS) 145 mcg capsule Take 290 mcg by mouth.     ??? metoprolol succinate (TOPROL-XL) 25 MG 24 hr tablet Take 25 mg by mouth daily.     ??? mirtazapine (REMERON) 15 MG tablet Take 15 mg by mouth nightly.      ??? montelukast (SINGULAIR) 5 MG chewable tablet CHEW TWO TABLETS BY MOUTH NIGHTLY 60 tablet 11   ??? nebulizer and compressor (VIOS AEROSOL DELIVERY SYSTEM) Devi 1 each by Miscellaneous route every six (6) hours as needed. 1 each 1   ??? olopatadine 0.2 % ophthalmic solution Administer 1 drop to both eyes daily as needed. 5 mL 2   ??? omalizumab (XOLAIR) 150 mg/mL injection Inject the contents of 2 syringes (300 mg total) under the skin every twenty-one (21) days. 2 mL 12   ??? prednisoLONE (ORAPRED) 15 mg/5 mL (3 mg/mL) solution Take 15 mg by mouth daily at 0600.     ??? propranolol (INDERAL) 10 MG tablet Take 10 mg by mouth Three (3) times a day.     ??? sodium bicarb-sodium chloride (AYR SALINE NASAL NETI RINSE) nasal packet 1 packet by Each Nare route Two (2) times a day. Rinse sinuses before using Flonase or Astepro nasal sprays.  0   ??? sucralfate (CARAFATE) 1 gram tablet Take 1 g by mouth 4 (four) times a day as needed. ??? tapentadol (NUCYNTA ER) 50 mg Tb12 Take 50 mg by mouth two (2) times a day.     ??? tapentadol (NUCYNTA) 75 mg tablet Take 75 mg by mouth 4 (four) times a day as needed.     ??? VENTOLIN HFA 90 mcg/actuation inhaler INHALE TWO PUFFS EVERY SIX (6) HOURS AS NEEDED FOR WHEEZING. WITH SPACER 18 Inhaler 6     Current Facility-Administered Medications   Medication Dose Route Frequency Provider Last Rate Last Dose   ??? omalizumab [name] injection 300 mg  300 mg Subcutaneous Q28 Days Gurney Maxin, MD   300 mg at 05/02/18 1504       Allergies   Allergen Reactions   ??? Haloperidol Other (See Comments)     dystonias  Dystonia   ??? Naltrexone Anaphylaxis     First dose of ultra low dose naltrexone (Nov. 2016)   ??? Pumpkin Seed Hives, Itching and Shortness Of Breath   ??? Quetiapine Other (See Comments)     Hypotension requiring [facility] to [facility] ICU (with 400 mg)   ??? Reglan [Metoclopramide Hcl] Other (See Comments)     Facial tics/spasms, ?tardive dyskinesia   ??? Morphine    ??? Adhesive Tape-Silicones Rash   ??? Almond Other (See Comments)     Unknown   ??? Latex Rash   ??? Olanzapine Other (See Comments)     Dystonia  akathisia   ??? Prochlorperazine Other (See Comments)     Dystonia  dystonias   ??? Promethazine Other (See Comments)     akathisia & dystonias  Dystonia   ??? Silver Rash  tegaderm patch not mesh   ??? Tree Nut Other (See Comments)     Walnuts and Pecans, almonds       Patient Active Problem List   Diagnosis   ??? Postural orthostatic tachycardia syndrome   ??? Thrombus of left atrial appendage without antecedent myocardial infarction   ??? Borderline personality disorder (RAF-HCC)   ??? Depression (RAF-HCC)   ??? Acute renal failure (CMS-HCC)   ??? Gastroenteritis   ??? Metabolic acidosis   ??? Mast cell disorder   ??? Multiple allergies   ??? Vomiting   ??? Urinary retention   ??? Iron deficiency anemia   ??? Smoking addiction   ??? Asthma   ??? Atrial fibrillation by electrocardiogram (CMS-HCC)   ??? Gastroesophageal reflux disease   ??? Nondiabetic gastroparesis   ??? Sprain of ankle   ??? Bradycardia   ??? Abdominal pain   ??? Nausea and vomiting   ??? Health seeking behavior   ??? Factitious disorder (RAF-HCC)   ??? History of suicide attempt   ??? Marijuana abuse   ??? Urinary tract infection   ??? Fibromyalgia   ??? Hypermobility syndrome   ??? Idiopathic hypotension   ??? Syncope   ??? Fever of unknown origin   ??? Right arm cellulitis   ??? Hypokalemia   ??? Alkalosis   ??? Laceration of digital nerve of finger       Reviewed and up to date in Epic.    Appropriateness of Therapy     Is medication and dose appropriate based on diagnosis? Yes    Baseline Quality of Life Assessment      How many days over the past month did your idiopathic andioedema/urticaria keep you from your normal activities? 0    Financial Information     Medication Assistance provided: None Required    Anticipated copay of $0 reviewed with patient. Verified delivery address.    Delivery Information     Scheduled delivery date: 01/13/19    Expected start date: 01/11/19    Medication will be delivered via Same Day Courier to the home address in [place].  This shipment will not require a signature.      Explained the services we provide at [facility] and that each month we would call to set up refills.  Stressed importance of returning phone calls so that we could ensure they receive their medications in time each month.  Informed patient that we should be setting up refills 7-10 days prior to when they will run out of medication.  A pharmacist will reach out to perform a clinical assessment periodically.  Informed patient that a welcome packet and a drug information handout will be sent.      Patient verbalized understanding of the above information as well as how to contact the pharmacy at [phone] option 4 with any questions/concerns.  The pharmacy is open Monday through Friday 8:30am-4:30pm.  A pharmacist is available 24/7 via pager to answer any clinical questions they may have.    Patient Specific Needs     ? Does the patient have any physical, cognitive, or cultural barriers? No    ? Patient prefers to have medications discussed with  Patient     ? Is the patient able to read and understand education materials at a high school level or above? Yes    ? Patient's primary language is  English     ? Is the patient high risk? No     ?  Does the patient require a Care Management Plan? No     ? Does the patient require physician intervention or other additional services (i.e. nutrition, smoking cessation, social work)? No      Prisha Hiley [facility] Specialty Pharmacist

## 2019-01-10 MED ORDER — EMPTY CONTAINER
2 refills | 0 days
Start: 2019-01-10 — End: ?

## 2019-01-13 MED FILL — EMPTY CONTAINER: 90 days supply | Qty: 1 | Fill #0

## 2019-01-13 MED FILL — XOLAIR 150 MG/ML SUBCUTANEOUS SYRINGE: 21 days supply | Qty: 2 | Fill #0 | Status: AC

## 2019-01-13 MED FILL — EMPTY CONTAINER: 90 days supply | Qty: 1 | Fill #0 | Status: AC

## 2019-01-16 NOTE — Unmapped (Addendum)
I spoke with pt and informed her of your msg below. 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 she said she's looking into  going to [facility] because of her case being complicated.    thanks     ST      ----- Message from Langston Reusing, MD sent at 01/16/2019  9:55 AM EDT -----  Regarding: RE: ACDF surg  Shama,  I see no recent cervical imaging.  She had cervical spine surgery at [facility] and is followed there.  She was staffed with Dr Lynwood Dawley in April 2019 for neck issues but then followed up with her Human resources officer.  She just saw [facility] on 01/09/19 who did not recommend any lumbar surgery.  I am not sure what I could add.  kp  ----- Message -----  From: Orlando Penner Turrentine  Sent: 01/14/2019  11:18 AM EDT  To: Langston Reusing, MD  Subject: ACDF surg                                        KP -could you take a look at imag? Dr Donnetta Hail referring for ACDF surg.    Pls advise,    Thanks  ST        Momina L. Beegle  Female, 33 y.o., 05/01/1986  MRN:   [id number]  Phone:   [phone] Judie Petit)  PCP:   [facility]  Primary Cvg:   MEDICARE/MEDICARE PART A AND PART B  Message   Received: 1 week ago   Message Contents   Amie [place]  Leota Jacobsen, RN; Irineo Axon D Turrentine  Caller: Unspecified (2 weeks ago)    ??  Previous Messages        referral Neurosurgery     Amie [place]  You; Leota Jacobsen, RN 8 days ago          Loraine,  ??  I have tried to reach out to this patient a couple times but it went straight to voicemail and I cannot leave a msg because the mailbox is full. I will try again later this week.      Documentation     Leota Jacobsen, RN  You; Amie [place] 12 days ago     Please advise. Attempted to reach out to [place], no answer. Routing comment     Hazel Sams, MD  Leota Jacobsen, RN 2 weeks ago     Referral is already in for [facility].   She was looking for a surgical opinion so nothing else I can do for her right now.   I would reach out to [facility] or Hardie Shackleton to get an update on the referral.   Routing comment     Leota Jacobsen, RN  Hazel Sams, MD 2 weeks ago     Please advise.   Routing comment     Leota Jacobsen, RN 2 weeks ago          Patient was in [place], ED. Is a patient of [facility]. I had  surgery 07/2017 Cervical spine fusion it was ACDF. Spoke with somebody and given an appointment with pain management. Spoke with Dr.Ingraham and need a referral for Neurosurgery. Patient needs surgery. Would reach out to the Neurosurgeon. Patient may need to go to [facility] if I don't hear from Dr.Ingraham concerning a Neurosurgeon.       Documentation     Fizza, Scales [phone]  Leota Jacobsen, RN 2 weeks ago

## 2019-01-23 NOTE — Unmapped (Signed)
[facility] Clinical Assessment & Refill Coordination Note    Sandy Chandler, DOB: [birth year]  Phone: [phone] (home)     All above HIPAA information was verified with patient.     Specialty Medication(s):   General Specialty: Xolair     Current Outpatient Medications   Medication Sig Dispense Refill   ??? albuterol (PROVENTIL,VENTOLIN) 2 mg/5 mL syrup Take 5 mL (2 mg total) by mouth every six (6) hours as needed. for up to 30 doses 120 mL 0   ??? albuterol 2.5 mg/0.5 mL nebulizer solution Inhale 0.5 mL (2.5 mg total) by nebulization every four (4) hours as needed for wheezing or shortness of breath (couh). 30 each 12   ??? cromolyn (GASTROCROM) 100 mg/5 mL solution TAKE 5 ML (100 MG TOTAL) BY MOUTH FOUR (4) TIMES A DAY (BEFORE MEALS AND NIGHTLY). 480 mL 11   ??? cyanocobalamin 1,000 mcg/mL injection Inject 1,000 mcg into the muscle.     ??? doxepin (SINEQUAN) 10 mg/mL solution Take 2 mL (20 mg total) by mouth three (3) times a day (at 6am, noon and 6pm).     ??? dronabinol (MARINOL) 10 MG capsule take 1 capsule by mouth twice a day before meals prn  0   ??? empty container (SHARPS CONTAINER) Misc use as directed 1 each 2   ??? enoxaparin (LOVENOX) 80 mg/0.8 mL injection Inject 0.8 mL (80 mg total) under the skin every twelve (12) hours. 60 Syringe 0   ??? EPIPEN 2-PAK 0.3 mg/0.3 mL injection Inject 0.3 mL (0.3 mg total) into the muscle once as needed for anaphylaxis. for up to 1 dose 2 Device 6   ??? esomeprazole (NEXIUM) 40 MG capsule Take 40 mg by mouth every morning before breakfast.     ??? famotidine (PEPCID) 40 MG tablet Take 1 tablet (40 mg total) by mouth Two (2) times a day. 60 tablet 0   ??? fexofenadine (ALLEGRA) 180 MG tablet Take 2 tablets (360 mg total) by mouth Two (2) times a day. 30 tablet 12   ??? fluticasone (FLONASE) 50 mcg/actuation nasal spray 2 sprays by Each Nare route daily. 16 g 12   ??? fluticasone propionate (FLOVENT HFA) 44 mcg/actuation inhaler Inhale 2 puffs Two (2) times a day. With spacer. Please rinse out mouth or brush teeth after use.     ??? HYDROmorphone (DILAUDID) 2 MG tablet Take 1 tablet (2 mg total) by mouth every four (4) hours as needed for pain,moderate (4-6). for up to 5 doses 5 tablet 0   ??? hydroxychloroquine (PLAQUENIL) 200 mg tablet Take 1 tablet (200 mg total) by mouth Two (2) times a day. 60 tablet 11   ??? hydrOXYzine (VISTARIL) 100 MG capsule Take 1 capsule (100 mg total) by mouth nightly. 30 capsule 0   ??? hydrOXYzine (VISTARIL) 25 MG capsule Take 25 to 50 mg as needed for MCAS flare or as pretreatment before exposure to triggers. 60 capsule 11   ??? ketotifen (ZADITOR) 0.025 % (0.035 %) ophthalmic solution Put 3 drops into a small amount of water or fluid and take by mouth up to 4 times a day as needed. 5 mL 0   ??? levonorgestrel (MIRENA) 20 mcg/24 hours (5 yrs) 52 mg IUD 1 each by Intrauterine route once.     ??? linaclotide (LINZESS) 145 mcg capsule Take 290 mcg by mouth.     ??? metoprolol succinate (TOPROL-XL) 25 MG 24 hr tablet Take 25 mg by mouth daily.     ???  mirtazapine (REMERON) 15 MG tablet Take 15 mg by mouth nightly.      ??? montelukast (SINGULAIR) 5 MG chewable tablet CHEW TWO TABLETS BY MOUTH NIGHTLY 60 tablet 11   ??? nebulizer and compressor (VIOS AEROSOL DELIVERY SYSTEM) Devi 1 each by Miscellaneous route every six (6) hours as needed. 1 each 1   ??? olopatadine 0.2 % ophthalmic solution Administer 1 drop to both eyes daily as needed. 5 mL 2   ??? omalizumab (XOLAIR) 150 mg/mL injection Inject the contents of 2 syringes (300 mg total) under the skin every twenty-one (21) days. 2 mL 12   ??? prednisoLONE (ORAPRED) 15 mg/5 mL (3 mg/mL) solution Take 15 mg by mouth daily at 0600.     ??? propranolol (INDERAL) 10 MG tablet Take 10 mg by mouth Three (3) times a day.     ??? sodium bicarb-sodium chloride (AYR SALINE NASAL NETI RINSE) nasal packet 1 packet by Each Nare route Two (2) times a day. Rinse sinuses before using Flonase or Astepro nasal sprays.  0   ??? sucralfate (CARAFATE) 1 gram tablet Take 1 g by mouth 4 (four) times a day as needed.     ??? tapentadol (NUCYNTA ER) 50 mg Tb12 Take 50 mg by mouth two (2) times a day.     ??? tapentadol (NUCYNTA) 75 mg tablet Take 75 mg by mouth 4 (four) times a day as needed.     ??? VENTOLIN HFA 90 mcg/actuation inhaler INHALE TWO PUFFS EVERY SIX (6) HOURS AS NEEDED FOR WHEEZING. WITH SPACER 18 Inhaler 6     Current Facility-Administered Medications   Medication Dose Route Frequency Provider Last Rate Last Dose   ??? omalizumab [name] injection 300 mg  300 mg Subcutaneous Q28 Days Gurney Maxin, MD   300 mg at 05/02/18 1504        Changes to medications: Taelor reports no changes at this time.    Allergies   Allergen Reactions   ??? Haloperidol Other (See Comments)     dystonias  Dystonia   ??? Naltrexone Anaphylaxis     First dose of ultra low dose naltrexone (Nov. 2016)   ??? Pumpkin Seed Hives, Itching and Shortness Of Breath   ??? Quetiapine Other (See Comments)     Hypotension requiring transfer from [facility] to OSH ICU (with 400 mg)   ??? Reglan [Metoclopramide Hcl] Other (See Comments)     Facial tics/spasms, ?tardive dyskinesia   ??? Morphine    ??? Adhesive Tape-Silicones Rash   ??? Almond Other (See Comments)     Unknown   ??? Latex Rash   ??? Olanzapine Other (See Comments)     Dystonia  akathisia   ??? Prochlorperazine Other (See Comments)     Dystonia  dystonias   ??? Promethazine Other (See Comments)     akathisia & dystonias  Dystonia   ??? Silver Rash     tegaderm patch not mesh   ??? Tree Nut Other (See Comments)     Walnuts and Pecans, almonds       Changes to allergies: No    SPECIALTY MEDICATION ADHERENCE     Xolair 150 mg/ml: 0 days of medicine on hand     Specialty medication(s) dose(s) confirmed: Regimen is correct and unchanged.     Are there any concerns with adherence? No    Adherence counseling provided? Not needed    CLINICAL MANAGEMENT AND INTERVENTION      Clinical Benefit Assessment:  Do you feel the medicine is effective or helping your condition? Yes    Clinical Benefit counseling provided? Progress note from 10/10/18 shows evidence of clinical benefit    Adverse Effects Assessment:    Are you experiencing any side effects? No    Are you experiencing difficulty administering your medicine? No    Quality of Life Assessment:    How many days over the past month did your asthma  keep you from your normal activities? For example, brushing your teeth or getting up in the morning. 0    Have you discussed this with your provider? Not needed    Therapy Appropriateness:    Is therapy appropriate? Yes, therapy is appropriate and should be continued    DISEASE/MEDICATION-SPECIFIC INFORMATION      For patients on injectable medications: Patient currently has 0 doses left.  Next injection is scheduled for 02/03/19.    PATIENT SPECIFIC NEEDS     ? Does the patient have any physical, cognitive, or cultural barriers? No    ? Is the patient high risk? No     ? Does the patient require a Care Management Plan? No     ? Does the patient require physician intervention or other additional services (i.e. nutrition, smoking cessation, social work)? No      SHIPPING     Specialty Medication(s) to be Shipped:   General Specialty: Xolair    Other medication(s) to be shipped: none     Changes to insurance: No    Delivery Scheduled: Yes, Expected medication delivery date: 01/29/19.     Medication will be delivered via Same Day Courier to the confirmed home address in [facility].    The patient will receive a drug information handout for each medication shipped and additional FDA Medication Guides as required.  Verified that patient has previously received a Conservation officer, historic buildings.    All of the patient's questions and concerns have been addressed.    Breck Coons Shared [facility] Specialty Pharmacist

## 2019-01-29 ENCOUNTER — Emergency Department: Admit: 2019-01-29 | Discharge: 2019-01-30 | Disposition: A | Discharge status: Home / Self Care | Payer: MEDICARE

## 2019-01-29 ENCOUNTER — Ambulatory Visit: Admit: 2019-01-29 | Discharge: 2019-01-30 | Disposition: A | Discharge status: Home / Self Care | Payer: MEDICARE

## 2019-01-29 DIAGNOSIS — M7989 Other specified soft tissue disorders: Secondary | ICD-10-CM

## 2019-01-29 DIAGNOSIS — M79604 Pain in right leg: Secondary | ICD-10-CM

## 2019-01-29 DIAGNOSIS — M79605 Pain in left leg: Principal | ICD-10-CM

## 2019-01-29 LAB — BASIC METABOLIC PANEL
ANION GAP: 11 mmol/L (ref 7–15)
BLOOD UREA NITROGEN: 14 mg/dL (ref 7–21)
CALCIUM: 9 mg/dL (ref 8.5–10.2)
CHLORIDE: 104 mmol/L (ref 98–107)
CO2: 18 mmol/L — ABNORMAL LOW (ref 22.0–30.0)
CREATININE: 0.83 mg/dL (ref 0.60–1.00)
EGFR CKD-EPI AA FEMALE: >90 mL/min/{1.73_m2} (ref >=60)
EGFR CKD-EPI NON-AA FEMALE: >90 mL/min/{1.73_m2} (ref >=60)
GLUCOSE RANDOM: 86 mg/dL (ref 70–179)
SODIUM: 133 mmol/L — ABNORMAL LOW (ref 135–145)

## 2019-01-29 LAB — D-DIMER QUANTITATIVE (CH,ML,PD,ET): Lab: <150

## 2019-01-29 LAB — CHLORIDE: Chloride:SCnc:Pt:Ser/Plas:Qn:: 104

## 2019-01-29 NOTE — Unmapped (Signed)
Sandy Chandler 's XOLAIR 150 mg/mL injection (omalizumab) shipment will be delayed due to Refill too soon until 01/30/2019 We have contacted the patient and communicated the delivery change to patient/caregiver We will reschedule the medication for the delivery date that the patient agreed upon. We have confirmed the delivery date as 01/30/2019 .

## 2019-01-30 MED FILL — XOLAIR 150 MG/ML SUBCUTANEOUS SYRINGE: 21 days supply | Qty: 2 | Fill #1 | Status: AC

## 2019-01-30 MED FILL — XOLAIR 150 MG/ML SUBCUTANEOUS SYRINGE: SUBCUTANEOUS | 21 days supply | Qty: 2 | Fill #1

## 2019-01-30 NOTE — Unmapped (Addendum)
[facility]  Emergency Department Provider Note      ED Clinical Impression     Final diagnoses:   Dependent edema (Primary)       Initial Impression, ED Course, Assessment and Plan     Impression: Sandy Chandler is a 33 y.o. female with past medical history of Ehlers-Danlos syndrome, hypercoagulopathy on Lovenox presented emergency department sent in by PCP for evaluation of bilateral lower extremity swelling, DVT rule out.    Overall suspicion for lower extremity DVTs low given physical examination findings.  Physical exam is most consistent with dependent edema.  Plan for bilateral lower extremity ultrasound to evaluate for any evidence of DVT.    Patient was pending laboratory Doppler results at the time of signout to oncoming resident.    ED Triage Vitals [01/29/19 1738]   Vital Signs Group      Temp 37 ??C (98.6 ??F)      Temp Source Oral      Heart Rate 103      SpO2 Pulse       Heart Rate Source Monitor      Resp 20      BP 151/86      MAP (mmHg)       BP Location Right arm      BP Method Automatic      Patient Position Sitting   SpO2 99 %       ED Course as of Jan 31 1444   Wed Jan 29, 2019   2143 Discussed with PVL tech.  Negative for DVT bilaterally.      2232 I personally discussed discharge instructions with the patient's, discussed anticipatory guidance, return precautions and outpatient plan for follow-up management.          Additional Medical Decision Making     I have reviewed the vital signs and the nursing notes. Labs and radiology results that were available during my care of the patient were independently reviewed by me and considered in my medical decision making.     Case discussed with ED attending Dr. Cyril Mourning who is agreeable with the plan.    ____________________________________________       History     Chief Complaint  Leg Swelling      HPI   Sandy Chandler is a 33 y.o. female with past medical history of Ehlers-Danlos syndrome, hypercoagulopathy on Lovenox presented emergency department sent in by PCP for evaluation of bilateral lower extremity swelling, DVT rule out.  Patient explains that over the last 2 to 3 days she has had bilateral lower extremity swelling, pitting edema.  Patient denies any history of similar symptoms.  Patient reports that she was evaluated by her primary care doctor who tried to set her up for [facility] outpatient ultrasound.  Patient was unable to get into [facility] for outpatient ultrasound and so primary care advised her to come to the emergency department here for further evaluation of her lower extremities and to rule out DVT given her significant history of coagulopathy.  Patient reports that she has been compliant with her Lovenox at home.  Patient denies any chest pain, shortness of breath, hemoptysis, abdominal pain, nausea, vomiting, diarrhea, fever, chills.     Review of Systems:  Constitutional: no fever.  Eyes: no visual changes.  ENT: no sore throat.  Cardiovascular: no chest pain.  Respiratory: no shortness of breath.  Gastrointestinal: no abdominal pain, no vomiting, no diarrhea.  Genitourinary: no dysuria.  Musculoskeletal: no back pain.  Bilateral lower extremity edema.  Skin: no rash.  Neurological: no headaches, no focal weakness or numbness.    Past Medical History:   Diagnosis Date   ??? Asthma    ??? Borderline personality disorder (CMS-HCC)    ??? Chronic back pain     from degenerative disc disease   ??? Chronic constipation    ??? Degenerative disc disease    ??? Depression     >30 psychiatric admissions   ??? Dysautonomia (CMS-HCC)    ??? 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 Ehlers-Danlos disease 2015    Genetics evaluation pending, hypermobile   ??? Iron deficiency anemia 06/24/2014   ??? 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 Work up for systemic mastocytosis including BM biopsy was negative   ??? Portal vein thrombosis 06/2012   ??? Postural orthostatic tachycardia syndrome    ??? Renal insufficiency     has been told in past she has some renal dysfunction, review of imaging from [facility] shows atrophy of R kidney   ??? Scoliosis    ??? Suicide attempt (CMS-HCC)     h/o multiple suicide attempts   ??? Urinary retention Nov 2014    required foley x 7 weeks, still occasionally has to I/O cath        Patient Active Problem List   Diagnosis   ??? Postural orthostatic tachycardia syndrome   ??? Thrombus of left atrial appendage without antecedent myocardial infarction   ??? Borderline personality disorder (RAF-HCC)   ??? Depression (RAF-HCC)   ??? Acute renal failure (CMS-HCC)   ??? Gastroenteritis   ??? Metabolic acidosis   ??? Mast cell disorder   ??? Multiple allergies   ??? Vomiting   ??? Urinary retention   ??? Iron deficiency anemia   ??? Smoking addiction   ??? Asthma   ??? Atrial fibrillation by electrocardiogram (CMS-HCC)   ??? Gastroesophageal reflux disease   ??? Nondiabetic gastroparesis   ??? Sprain of ankle   ??? Bradycardia   ??? Abdominal pain   ??? Nausea and vomiting   ??? Health seeking behavior   ??? Factitious disorder (RAF-HCC)   ??? History of suicide attempt   ??? Marijuana abuse   ??? Urinary tract infection   ??? Fibromyalgia   ??? Hypermobility syndrome   ??? Idiopathic hypotension   ??? Syncope   ??? Fever of unknown origin   ??? Right arm cellulitis   ??? Hypokalemia   ??? Alkalosis   ??? Laceration of digital nerve of finger       Past Surgical History:   Procedure Laterality Date   ??? ANTERIOR FUSION CERVICAL SPINE     ??? BOWEL RESECTION      for ?ischemia in setting of emboli from cardiac thrombus   ??? BRONCHOSCOPY      for aspirated magnets   ??? EXPLORATORY LAPAROTOMY      bowel resection for ingested magnets   ??? PR MICROSURG TECHNIQUES,REQ OPER MICROSCOPE Left 10/23/2017    Procedure: MICROSURGICAL TECHNIQUES, REQUIRING USE OF OPERATING MICROSCOPE (LIST SEPARATELY IN ADDITION TO CODE FOR PRIMARY PROCEDURE); Surgeon: Garnett Farm, MD;  Location: ASC OR [facility];  Service: Ortho Hand   ??? PR REPAIR MUSCLES OF HAND Left 10/23/2017    Procedure: REPR INTRINSIC MUSCL HAND;  Surgeon: Jamesetta Geralds  Draeger, MD;  Location: ASC OR [facility];  Service: Ortho Hand   ??? PR REPAIR OF DIGIT NERVE Left 10/23/2017    Procedure: SUTURE DIGITAL NERV HAND/FT; 1 NERV;  Surgeon: Jamesetta Geralds Draeger, MD;  Location: ASC OR [facility];  Service: Ortho Hand         Current Facility-Administered Medications:   ???  omalizumab [name] injection 300 mg, 300 mg, Subcutaneous, Q28 Days, Gurney Maxin, MD, 300 mg at 05/02/18 1504    Current Outpatient Medications:   ???  albuterol (PROVENTIL,VENTOLIN) 2 mg/5 mL syrup, Take 5 mL (2 mg total) by mouth every six (6) hours as needed. for up to 30 doses, Disp: 120 mL, Rfl: 0  ???  albuterol 2.5 mg/0.5 mL nebulizer solution, Inhale 0.5 mL (2.5 mg total) by nebulization every four (4) hours as needed for wheezing or shortness of breath (couh)., Disp: 30 each, Rfl: 12  ???  cromolyn (GASTROCROM) 100 mg/5 mL solution, TAKE 5 ML (100 MG TOTAL) BY MOUTH FOUR (4) TIMES A DAY (BEFORE MEALS AND NIGHTLY)., Disp: 480 mL, Rfl: 11  ???  cyanocobalamin 1,000 mcg/mL injection, Inject 1,000 mcg into the muscle., Disp: , Rfl:   ???  doxepin (SINEQUAN) 10 mg/mL solution, Take 2 mL (20 mg total) by mouth three (3) times a day (at 6am, noon and 6pm)., Disp: , Rfl:   ???  dronabinol (MARINOL) 10 MG capsule, take 1 capsule by mouth twice a day before meals prn, Disp: , Rfl: 0  ???  empty container (SHARPS CONTAINER) Misc, use as directed, Disp: 1 each, Rfl: 2  ???  enoxaparin (LOVENOX) 80 mg/0.8 mL injection, Inject 0.8 mL (80 mg total) under the skin every twelve (12) hours., Disp: 60 Syringe, Rfl: 0  ???  EPIPEN 2-PAK 0.3 mg/0.3 mL injection, Inject 0.3 mL (0.3 mg total) into the muscle once as needed for anaphylaxis. for up to 1 dose, Disp: 2 Device, Rfl: 6  ???  esomeprazole (NEXIUM) 40 MG capsule, Take 40 mg by mouth every morning before breakfast., Disp: , Rfl:   ???  famotidine (PEPCID) 40 MG tablet, Take 1 tablet (40 mg total) by mouth Two (2) times a day., Disp: 60 tablet, Rfl: 0  ???  fexofenadine (ALLEGRA) 180 MG tablet, Take 2 tablets (360 mg total) by mouth Two (2) times a day., Disp: 30 tablet, Rfl: 12  ???  fluticasone (FLONASE) 50 mcg/actuation nasal spray, 2 sprays by Each Nare route daily., Disp: 16 g, Rfl: 12  ???  fluticasone propionate (FLOVENT HFA) 44 mcg/actuation inhaler, Inhale 2 puffs Two (2) times a day. With spacer. Please rinse out mouth or brush teeth after use., Disp: , Rfl:   ???  HYDROmorphone (DILAUDID) 2 MG tablet, Take 1 tablet (2 mg total) by mouth every four (4) hours as needed for pain,moderate (4-6). for up to 5 doses, Disp: 5 tablet, Rfl: 0  ???  hydroxychloroquine (PLAQUENIL) 200 mg tablet, Take 1 tablet (200 mg total) by mouth Two (2) times a day., Disp: 60 tablet, Rfl: 11  ???  hydrOXYzine (VISTARIL) 100 MG capsule, Take 1 capsule (100 mg total) by mouth nightly., Disp: 30 capsule, Rfl: 0  ???  hydrOXYzine (VISTARIL) 25 MG capsule, Take 25 to 50 mg as needed for MCAS flare or as pretreatment before exposure to triggers., Disp: 60 capsule, Rfl: 11  ???  ketotifen (ZADITOR) 0.025 % (0.035 %) ophthalmic solution, Put 3 drops into a small amount of water or fluid and take by mouth up to  4 times a day as needed., Disp: 5 mL, Rfl: 0  ???  levonorgestrel (MIRENA) 20 mcg/24 hours (5 yrs) 52 mg IUD, 1 each by Intrauterine route once., Disp: , Rfl:   ???  linaclotide (LINZESS) 145 mcg capsule, Take 290 mcg by mouth., Disp: , Rfl:   ???  metoprolol succinate (TOPROL-XL) 25 MG 24 hr tablet, Take 25 mg by mouth daily., Disp: , Rfl:   ???  mirtazapine (REMERON) 15 MG tablet, Take 15 mg by mouth nightly. , Disp: , Rfl:   ???  montelukast (SINGULAIR) 5 MG chewable tablet, CHEW TWO TABLETS BY MOUTH NIGHTLY, Disp: 60 tablet, Rfl: 11  ???  nebulizer and compressor (VIOS AEROSOL DELIVERY SYSTEM) Devi, 1 each by Miscellaneous route every six (6) hours as needed., Disp: 1 each, Rfl: 1  ???  olopatadine 0.2 % ophthalmic solution, Administer 1 drop to both eyes daily as needed., Disp: 5 mL, Rfl: 2  ???  omalizumab (XOLAIR) 150 mg/mL injection, Inject the contents of 2 syringes (300 mg total) under the skin every twenty-one (21) days., Disp: 2 mL, Rfl: 12  ???  prednisoLONE (ORAPRED) 15 mg/5 mL (3 mg/mL) solution, Take 15 mg by mouth daily at 0600., Disp: , Rfl:   ???  propranolol (INDERAL) 10 MG tablet, Take 10 mg by mouth Three (3) times a day., Disp: , Rfl:   ???  sodium bicarb-sodium chloride (AYR SALINE NASAL NETI RINSE) nasal packet, 1 packet by Each Nare route Two (2) times a day. Rinse sinuses before using Flonase or Astepro nasal sprays., Disp: , Rfl: 0  ???  sucralfate (CARAFATE) 1 gram tablet, Take 1 g by mouth 4 (four) times a day as needed., Disp: , Rfl:   ???  tapentadol (NUCYNTA ER) 50 mg Tb12, Take 50 mg by mouth two (2) times a day., Disp: , Rfl:   ???  tapentadol (NUCYNTA) 75 mg tablet, Take 75 mg by mouth 4 (four) times a day as needed., Disp: , Rfl:   ???  VENTOLIN HFA 90 mcg/actuation inhaler, INHALE TWO PUFFS EVERY SIX (6) HOURS AS NEEDED FOR WHEEZING. WITH SPACER, Disp: 18 Inhaler, Rfl: 6    Allergies  Haloperidol; Naltrexone; Pumpkin seed; Quetiapine; Reglan [metoclopramide hcl]; Morphine; Adhesive tape-silicones; Almond; Latex; Olanzapine; Prochlorperazine; Promethazine; Silver; and Tree nut    Family History   Problem Relation Age of Onset   ??? Deep vein thrombosis Paternal Grandfather    ??? Cancer Mother         uterine   ??? Lupus Mother    ??? Personality disorder Mother    ??? Rheum arthritis Maternal Grandmother    ??? Rheum arthritis Cousin    ??? Heart disease Maternal Uncle         Maternal side including aunts, uncles, grandparents   ??? Personality disorder Maternal Grandfather        Social History  Social History     Tobacco Use   ??? Smoking status: Former Smoker     Packs/day: 0.33     Years: 15.00     Pack years: 4.95     Types: Cigarettes   ??? Smokeless tobacco: Never Used Substance Use Topics   ??? Alcohol use: No     Alcohol/week: 0.0 standard drinks   ??? Drug use: Not Currently     Types: Marijuana     Comment: every other day, reports 1.5 grams a week         Physical Exam     ED Triage  Vitals [01/29/19 1738]   Enc Vitals Group      BP 151/86      Heart Rate 103      SpO2 Pulse       Resp 20      Temp 37 ??C (98.6 ??F)      Temp Source Oral      SpO2 99 %      Weight       Height       Head Circumference       Peak Flow       Pain Score       Pain Loc       Pain Edu?       Excl. in GC?        Constitutional: Alert and oriented. Well appearing. In no distress.  Eyes: Conjunctivae are normal.  ENT       Head: Normocephalic and atraumatic.       Nose: No congestion.       Mouth/Throat: Mucous membranes are moist.       Neck: No stridor.  Hematological/Lymphatic/Immunilogical: No cervical lymphadenopathy.  Cardiovascular: rate as vital signs, regular rhythm. Normal and symmetric distal pulses are present in all extremities.  Respiratory: Normal respiratory effort, symmetrical expansion of chest. No wheezes, no rales.  Gastrointestinal: Soft and nontender. No rigid abdomen present.  Genitourinary: Deferred  Musculoskeletal: No tenderness of bilateral LEs.  1+ pitting edema up to the ankles bilaterally.  No overlying skin changes.  Calves are soft, symmetric, nontender to deep palpation.  Neurologic: Normal speech and language. No gross focal neurologic deficits are appreciated.  Skin: Skin is warm, dry and intact. No rash noted.  Psychiatric: Mood and affect are normal. Speech and behavior are normal.    Radiology     PVL Venous Duplex Lower Extremity Bilateral   Final Result          Pertinent labs & imaging results that were available during my care of the patient were reviewed by me and considered in my medical decision making (see chart for details).     Please note- This chart has been created using [employer]. Chart creation errors have been sought, but may not always be located and such creation errors, especially pronoun confusion, do NOT reflect on the standard of medical care.        Dene Gentry, MD  Resident  01/29/19 0960       Dene Gentry, MD  Resident  01/31/19 [phone]

## 2019-01-30 NOTE — Unmapped (Signed)
Pt presents with bilateral leg pain/swelling that started last night. No redness or signs of cellulitis noted. Pt denies cough,fevers and sob.

## 2019-01-30 NOTE — Unmapped (Signed)
Received sign out from previous provider.     ED I-PASS Handoff  ?? Illness Severit: stable  ?? Patient Summary: Sandy Chandler is a 33 y.o. female with past medical history of Ehlers-Danlos syndrome, hypercoagulopathy on Lovenox who presented to the emergency department after being seen by PCP for evaluation of bilateral lower extremity edema and DVT rule out.  Patient's exam consistent with bilateral lower extremity dependent edema but she is awaiting bilateral lower extremity PVL studies.  Anticipate discharge pending negative imaging.  ?? Action List:   ?? Follow-up PVLs  ?? Situation Awareness (Contingency Planning): Anticipate discharge.=  ?? Synthesis by Receiver    Assessment & Plan:    ED Course as of Jan 29 29   Wed Jan 29, 2019   2143 Discussed with PVL tech.  Negative for DVT bilaterally.      2232 I personally discussed discharge instructions with the patient's, discussed anticipatory guidance, return precautions and outpatient plan for follow-up management.        I discussed my evaluation of the patient's symptoms, my clinical impression, and my proposed outpatient treatment plan.  We have discussed anticipatory guidance, scheduled follow-up, and careful return precautions. I provided an opportunity and answered any questions the patient had. The patient expresses understanding and is comfortable with the discharge plan.

## 2019-02-06 MED ORDER — FAMOTIDINE 20 MG TABLET
ORAL_TABLET | Freq: Two times a day (BID) | ORAL | 3 refills | 90 days | Status: CP
Start: 2019-02-06 — End: 2020-04-19

## 2019-02-06 MED ORDER — CROMOLYN 100 MG/5 ML ORAL CONCENTRATE
Freq: Four times a day (QID) | ORAL | 11 refills | 24.00000 days | Status: CP
Start: 2019-02-06 — End: 2020-02-06

## 2019-02-06 NOTE — Unmapped (Signed)
Patient last seen 3/20; requesting refill of cromolyn and pepcid. Please review

## 2019-02-11 NOTE — Unmapped (Signed)
[facility] Refill Coordination Note    Specialty Medication(s) to be Shipped:   General Specialty: xolair 150mg /ml    Other medication(s) to be shipped:       Sandy Chandler, DOB: [birth year]  Phone: There are no phone numbers on file.      All above HIPAA information was verified with patient.     Completed refill call assessment today to schedule patient's medication shipment from the [facility] [phone]).       Specialty medication(s) and dose(s) confirmed: Regimen is correct and unchanged.   Changes to medications: [name] reports no changes at this time.  Changes to insurance: No  Questions for the pharmacist: No    Confirmed patient received Welcome Packet with first shipment. The patient will receive a drug information handout for each medication shipped and additional FDA Medication Guides as required.       DISEASE/MEDICATION-SPECIFIC INFORMATION        N/A    SPECIALTY MEDICATION ADHERENCE     Medication Adherence    Patient reported X missed doses in the last month: 0  Specialty Medication: Xolair 150mg /ml  Patient is on additional specialty medications: No                Xolair 150 mg/ml: 0 days of medicine on hand       SHIPPING     Shipping address confirmed in Epic.     Delivery Scheduled: Yes, Expected medication delivery date: 07/30.     Medication will be delivered via Same Day Courier to the home address in [place].    Sandy Chandler   [facility] Specialty Technician

## 2019-02-20 MED FILL — XOLAIR 150 MG/ML SUBCUTANEOUS SYRINGE: SUBCUTANEOUS | 21 days supply | Qty: 2 | Fill #2

## 2019-02-20 MED FILL — XOLAIR 150 MG/ML SUBCUTANEOUS SYRINGE: 21 days supply | Qty: 2 | Fill #2 | Status: AC

## 2019-03-10 NOTE — Unmapped (Signed)
[facility] Refill Coordination Note    Specialty Medication(s) to be Shipped:   General Specialty: xolair 150mg /ml    Other medication(s) to be shipped:       Sandy Chandler, DOB: 10/20/1985  Phone: There are no phone numbers on file.      All above HIPAA information was verified with patient.     Completed refill call assessment today to schedule patient's medication shipment from the [facility] [phone]).       Specialty medication(s) and dose(s) confirmed: Regimen is correct and unchanged.   Changes to medications: Sandy Chandler reports no changes at this time.  Changes to insurance: No  Questions for the pharmacist: No    Confirmed patient received Welcome Packet with first shipment. The patient will receive a drug information handout for each medication shipped and additional FDA Medication Guides as required.       DISEASE/MEDICATION-SPECIFIC INFORMATION        N/A    SPECIALTY MEDICATION ADHERENCE     Medication Adherence    Patient reported X missed doses in the last month: 0  Specialty Medication: xolair 150mg /ml  Patient is on additional specialty medications: No                Xolair 150 mg/ml: 0 days of medicine on hand       SHIPPING     Shipping address confirmed in Epic.     Delivery Scheduled: Yes, Expected medication delivery date: 08/19.     Medication will be delivered via Same Day Courier to the home address in Epic [place].    Sandy Chandler   [facility] Specialty Technician

## 2019-03-12 MED FILL — XOLAIR 150 MG/ML SUBCUTANEOUS SYRINGE: 21 days supply | Qty: 2 | Fill #3 | Status: AC

## 2019-03-12 MED FILL — XOLAIR 150 MG/ML SUBCUTANEOUS SYRINGE: SUBCUTANEOUS | 21 days supply | Qty: 2 | Fill #3

## 2019-03-17 NOTE — Unmapped (Signed)
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    Orders Placed This Encounter   Procedures   ??? Ambulatory referral to Pain Psychology        Harvie Junior, MD PhD  Assistant Professor of Medicine  Division of Rheumatology, Allergy & Immunology  [place] [facility] [place] [facility].

## 2019-03-17 NOTE — Unmapped (Signed)
I received a page from Sandy Chandler this evening. Sandy Chandler is a 33 yo female with history of MCAS and POTS who reports having a reaction which involved facial flushing, warmth, and facial swelling after going to the store. She administered EpiPen 5 minutes prior to our conversation and noticed improvement in facial flushing and warmth without improvement in facial swelling. She denies wheezing, respiratory symptoms, throat swelling, or lip swelling. She reports a few days prior to had fever and chills that lasted for 12 hours. She had COVID testing on Friday which was negative. She reports having increased frequency of reactions most notable triggers include weather changes and allergies. We recommended continuing her current course if her symptoms stay stable. If her symptoms do not improve by the morning, then we recommended calling the clinic for further recommendations. She is currently on daily steroids, in addition to her other mast cell activation medications.

## 2019-03-25 NOTE — Unmapped (Signed)
09/01-pt states she has been running a fever for 10 days,and she has not taken her last injection of Xolair that was shipped,she is going to follow-up with provider before injecting ,and I advised her we would follow-up in 2 weeks to see if and when she needed her next delivery-CB

## 2019-04-08 DIAGNOSIS — L501 Idiopathic urticaria: Secondary | ICD-10-CM

## 2019-04-08 DIAGNOSIS — D8940 Mast cell activation, unspecified: Secondary | ICD-10-CM

## 2019-04-09 MED ORDER — OMALIZUMAB 150 MG/ML SUBCUTANEOUS SYRINGE
SUBCUTANEOUS | 12 refills | 28.00000 days | Status: CP
Start: 2019-04-09 — End: ?
  Filled 2019-04-17: qty 4, 28d supply, fill #0

## 2019-04-09 NOTE — Unmapped (Signed)
[facility] Clinical Assessment & Refill Coordination Note    Ciria Bernardini, DOB: 04/17/1986  Phone: [phone] (home)     All above HIPAA information was verified with patient.     Specialty Medication(s):   General Specialty: Geoffry Paradise     Current Outpatient Medications   Medication Sig Dispense Refill   ??? albuterol (PROVENTIL,VENTOLIN) 2 mg/5 mL syrup Take 5 mL (2 mg total) by mouth every six (6) hours as needed. for up to 30 doses 120 mL 0   ??? albuterol 2.5 mg/0.5 mL nebulizer solution Inhale 0.5 mL (2.5 mg total) by nebulization every four (4) hours as needed for wheezing or shortness of breath (couh). 30 each 12   ??? cromolyn (GASTROCROM) 100 mg/5 mL solution TAKE 5 ML (100 MG TOTAL) BY MOUTH FOUR (4) TIMES A DAY (BEFORE MEALS AND NIGHTLY). 480 mL 11   ??? cyanocobalamin 1,000 mcg/mL injection Inject 1,000 mcg into the muscle.     ??? doxepin (SINEQUAN) 10 mg/mL solution Take 2 mL (20 mg total) by mouth three (3) times a day (at 6am, noon and 6pm).     ??? dronabinol (MARINOL) 10 MG capsule take 1 capsule by mouth twice a day before meals prn  0   ??? empty container (SHARPS CONTAINER) Misc use as directed 1 each 2   ??? enoxaparin (LOVENOX) 80 mg/0.8 mL injection Inject 0.8 mL (80 mg total) under the skin every twelve (12) hours. 60 Syringe 0   ??? EPIPEN 2-PAK 0.3 mg/0.3 mL injection Inject 0.3 mL (0.3 mg total) into the muscle once as needed for anaphylaxis. for up to 1 dose 2 Device 6   ??? esomeprazole (NEXIUM) 40 MG capsule Take 40 mg by mouth every morning before breakfast.     ??? famotidine (PEPCID) 20 MG tablet TAKE 1 TABLET (20 MG TOTAL) BY MOUTH TWO (2) TIMES A DAY. 180 tablet 3   ??? famotidine (PEPCID) 40 MG tablet Take 1 tablet (40 mg total) by mouth Two (2) times a day. 60 tablet 0   ??? fexofenadine (ALLEGRA) 180 MG tablet Take 2 tablets (360 mg total) by mouth Two (2) times a day. 30 tablet 12   ??? fluticasone (FLONASE) 50 mcg/actuation nasal spray 2 sprays by Each Nare route daily. 16 g 12   ??? fluticasone propionate (FLOVENT HFA) 44 mcg/actuation inhaler Inhale 2 puffs Two (2) times a day. With spacer. Please rinse out mouth or brush teeth after use.     ??? HYDROmorphone (DILAUDID) 2 MG tablet Take 1 tablet (2 mg total) by mouth every four (4) hours as needed for pain,moderate (4-6). for up to 5 doses 5 tablet 0   ??? hydroxychloroquine (PLAQUENIL) 200 mg tablet Take 1 tablet (200 mg total) by mouth Two (2) times a day. 60 tablet 11   ??? hydrOXYzine (VISTARIL) 100 MG capsule Take 1 capsule (100 mg total) by mouth nightly. 30 capsule 0   ??? hydrOXYzine (VISTARIL) 25 MG capsule Take 25 to 50 mg as needed for MCAS flare or as pretreatment before exposure to triggers. 60 capsule 11   ??? ketotifen (ZADITOR) 0.025 % (0.035 %) ophthalmic solution Put 3 drops into a small amount of water or fluid and take by mouth up to 4 times a day as needed. 5 mL 0   ??? levonorgestrel (MIRENA) 20 mcg/24 hours (5 yrs) 52 mg IUD 1 each by Intrauterine route once.     ??? linaclotide (LINZESS) 145 mcg capsule Take  290 mcg by mouth.     ??? metoprolol succinate (TOPROL-XL) 25 MG 24 hr tablet Take 25 mg by mouth daily.     ??? mirtazapine (REMERON) 15 MG tablet Take 15 mg by mouth nightly.      ??? montelukast (SINGULAIR) 5 MG chewable tablet CHEW TWO TABLETS BY MOUTH NIGHTLY 60 tablet 11   ??? nebulizer and compressor (VIOS AEROSOL DELIVERY SYSTEM) Devi 1 each by Miscellaneous route every six (6) hours as needed. 1 each 1   ??? olopatadine 0.2 % ophthalmic solution Administer 1 drop to both eyes daily as needed. 5 mL 2   ??? omalizumab (XOLAIR) 150 mg/mL injection Inject the contents of 2 syringes (300 mg total) under the skin every twenty-one (21) days. 2 mL 12   ??? prednisoLONE (ORAPRED) 15 mg/5 mL (3 mg/mL) solution Take 15 mg by mouth daily at 0600.     ??? propranolol (INDERAL) 10 MG tablet Take 10 mg by mouth Three (3) times a day.     ??? sodium bicarb-sodium chloride (AYR SALINE NASAL NETI RINSE) nasal packet 1 packet by Each Nare route Two (2) times a day. Rinse sinuses before using Flonase or Astepro nasal sprays.  0   ??? sucralfate (CARAFATE) 1 gram tablet Take 1 g by mouth 4 (four) times a day as needed.     ??? tapentadol (NUCYNTA ER) 50 mg Tb12 Take 50 mg by mouth two (2) times a day.     ??? tapentadol (NUCYNTA) 75 mg tablet Take 75 mg by mouth 4 (four) times a day as needed.     ??? VENTOLIN HFA 90 mcg/actuation inhaler INHALE TWO PUFFS EVERY SIX (6) HOURS AS NEEDED FOR WHEEZING. WITH SPACER 18 Inhaler 6     Current Facility-Administered Medications   Medication Dose Route Frequency Provider Last Rate Last Dose   ??? omalizumab [name] injection 300 mg  300 mg Subcutaneous Q28 Days Gurney Maxin, MD   300 mg at 05/02/18 1504        Changes to medications: Betsabe reports no changes at this time.    Allergies   Allergen Reactions   ??? Haloperidol Other (See Comments)     dystonias  Dystonia   ??? Naltrexone Anaphylaxis     First dose of ultra low dose naltrexone (Nov. 2016)   ??? Pumpkin Seed Hives, Itching and Shortness Of Breath   ??? Quetiapine Other (See Comments)     Hypotension requiring transfer from [facility] to OSH ICU (with 400 mg)   ??? Reglan [Metoclopramide Hcl] Other (See Comments)     Facial tics/spasms, ?tardive dyskinesia   ??? Morphine    ??? Adhesive Tape-Silicones Rash   ??? Almond Other (See Comments)     Unknown   ??? Latex Rash   ??? Olanzapine Other (See Comments)     Dystonia  akathisia   ??? Prochlorperazine Other (See Comments)     Dystonia  dystonias   ??? Promethazine Other (See Comments)     akathisia & dystonias  Dystonia   ??? Silver Rash     tegaderm patch not mesh   ??? Tree Nut Other (See Comments)     Walnuts and Pecans, almonds       Changes to allergies: No    SPECIALTY MEDICATION ADHERENCE     Xolair 150 mg/ml: 0 days of medicine on hand     Specialty medication(s) dose(s) confirmed: Patient reports changes to the regimen as follows: Now injecting every 2 weeks instead  of 3 weeks.  Req. new rx from provider Are there any concerns with adherence? No    Adherence counseling provided? Not needed    CLINICAL MANAGEMENT AND INTERVENTION      Clinical Benefit Assessment:    Do you feel the medicine is effective or helping your condition? Yes    Clinical Benefit counseling provided? Progress note from 10/10/18 shows evidence of clinical benefit    Adverse Effects Assessment:    Are you experiencing any side effects? No    Are you experiencing difficulty administering your medicine? No    Quality of Life Assessment:    How many days over the past month did your Idiopathic angio-edema-urticaria; Mast cell activation syndrome keep you from your normal activities? For example, brushing your teeth or getting up in the morning. 0    Have you discussed this with your provider? Not needed    Therapy Appropriateness:    Is therapy appropriate? Yes, therapy is appropriate and should be continued    DISEASE/MEDICATION-SPECIFIC INFORMATION      For patients on injectable medications: Patient currently has 0 doses left.  Next injection is scheduled for as soon as she receives medication.    PATIENT SPECIFIC NEEDS     ? Does the patient have any physical, cognitive, or cultural barriers? No    ? Is the patient high risk? No     ? Does the patient require a Care Management Plan? No     ? Does the patient require physician intervention or other additional services (i.e. nutrition, smoking cessation, social work)? No      SHIPPING     Specialty Medication(s) to be Shipped:   General Specialty: Xolair    Other medication(s) to be shipped: none     Changes to insurance: No    Delivery Scheduled: Yes, Expected medication delivery date: 04/11/19.     Medication will be delivered via Same Day Courier to the confirmed home address in [facility].    The patient will receive a drug information handout for each medication shipped and additional FDA Medication Guides as required.  Verified that patient has previously received a Conservation officer, historic buildings.    All of the patient's questions and concerns have been addressed.    Breck Coons [facility] Specialty Pharmacist

## 2019-04-15 NOTE — Unmapped (Signed)
Sandy Chandler 's xolair shipment will be delayed as a result of insufficient inventory of the drug.     I have reached out to the patient and left a voicemail message.  We will wait for a call back from the patient to reschedule the delivery.  We have not confirmed the new delivery date.

## 2019-04-16 NOTE — Unmapped (Signed)
[name] called in 04/16/2019. Please send Xolair on Same Day Courier 04/17/2019.   Thank you

## 2019-04-17 MED FILL — XOLAIR 150 MG/ML SUBCUTANEOUS SYRINGE: 28 days supply | Qty: 4 | Fill #0 | Status: AC

## 2019-04-22 DIAGNOSIS — D8940 Mast cell activation, unspecified: Secondary | ICD-10-CM

## 2019-04-22 MED ORDER — EPIPEN 2-PAK 0.3 MG/0.3 ML INJECTION, AUTO-INJECTOR
Freq: Once | INTRAMUSCULAR | 6 refills | 0.00000 days | Status: CP | PRN
Start: 2019-04-22 — End: ?

## 2019-04-29 NOTE — Unmapped (Signed)
[facility] Refill Coordination Note    Specialty Medication(s) to be Shipped:   General Specialty: Xolair    Other medication(s) to be shipped: none     Sandy Chandler, DOB: 1986-04-10  Phone: [phone] (home)       All above HIPAA information was verified with patient.     Completed refill call assessment today to schedule patient's medication shipment from the [facility] [phone]).       Specialty medication(s) and dose(s) confirmed: Regimen is correct and unchanged.   Changes to medications: Larae reports no changes at this time.  Changes to insurance: No  Questions for the pharmacist: No    Confirmed patient received Welcome Packet with first shipment. The patient will receive a drug information handout for each medication shipped and additional FDA Medication Guides as required.       DISEASE/MEDICATION-SPECIFIC INFORMATION        For patients on injectable medications: Patient currently has 1 doses left.  Next injection is scheduled for 05/01/19.    SPECIALTY MEDICATION ADHERENCE          Xolair 120 mg/ml: 3 days of medicine on hand         SHIPPING     Shipping address confirmed in Epic.     Delivery Scheduled: Yes, Expected medication delivery date: 05/13/19.     Medication will be delivered via Same Day Courier to the home address in Epic [place].    Lord Lancour [facility] Specialty Pharmacist

## 2019-05-13 MED FILL — XOLAIR 150 MG/ML SUBCUTANEOUS SYRINGE: SUBCUTANEOUS | 28 days supply | Qty: 4 | Fill #1

## 2019-05-13 MED FILL — XOLAIR 150 MG/ML SUBCUTANEOUS SYRINGE: 28 days supply | Qty: 4 | Fill #1 | Status: AC

## 2019-05-14 DIAGNOSIS — L501 Idiopathic urticaria: Principal | ICD-10-CM

## 2019-05-14 DIAGNOSIS — D8940 Mast cell activation, unspecified: Principal | ICD-10-CM

## 2019-05-14 MED ORDER — EPINEPHRINE 0.3 MG/0.3 ML INJECTION, AUTO-INJECTOR: 0 mg | each | Freq: Once | 4 refills | 0 days | Status: AC

## 2019-05-15 ENCOUNTER — Telehealth
Admit: 2019-05-15 | Discharge: 2019-05-16 | Payer: MEDICARE | Attending: Allergy & Immunology | Primary: Allergy & Immunology

## 2019-05-15 MED ORDER — CROMOLYN 20 MG/2 ML SOLUTION FOR NEBULIZATION: 20 mg | mL | 6 refills | 0 days | Status: AC

## 2019-05-15 MED ORDER — CROMOLYN 100 MG/5 ML ORAL CONCENTRATE: 200 mg | mL | Freq: Four times a day (QID) | 11 refills | 12 days | Status: AC

## 2019-05-16 NOTE — Unmapped (Addendum)
It was a pleasure seeing you today.  You saw Dr. Hoy Finlay (rhymes with Naoma Diener).    If you have any non-urgent questions or concerns, you can contact me (Dr. Hoy Finlay) phone at [phone];  by fax at [phone]; by email at [email or web address]; or via [email or web address] ([email or web address]).     For urgent concerns, please contact the [facility] Allergy/Immunology doctor on call at [phone].    [email or web address]    We discussed:      ICD-10-CM   1. 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      Requested Prescriptions     Signed Prescriptions Disp Refills   ??? cromolyn (INTAL) 20 mg/2 mL nebulizer solution 80 mL 6     Sig: Inhale 2 mL (20 mg total) by nebulization every four (4) hours as needed for allergies for up to 280 doses.   ??? cromolyn (GASTROCROM) 100 mg/5 mL solution 480 mL 11     Sig: Take 10 mL (200 mg total) by mouth Four (4) times a day (before meals and nightly).       Return in about 4 months (around 09/15/2019).     Below is a summary of what we discussed during today's visit.     Plan:  ??Mast cell treatment plan   1. Continue Doxepin 10 mg TID   2. Continue Famotidine 40 mg BID    3. Continue Claritin 10 mg BID  4. Continue Gastrocrom 100 mg QID ACHS (and periodically use it topically on skin for itching and redness) -- Plan to increase to 200 mg QID ACHS to help control symptoms.   5. Montelukast 10 mg QHS  6. Continue hydroxychloroquine 200 mg BID  7. Nasalcrom 4% 2 sprays each nostril BID to TID PRN (5.2 mg/spray); White petrolatum jelly PRN  8. 100 mg QD and 25-50mg  Q6hours as needed for MCAS flare and as pre-treatment before exposure to known triggers.   9. Omalizumab 300 mg q2 weeks at [facility] [employer]. Consider increasing to 375 mg q2 weeks if symptoms do not improve. Another option would be switching to Dupixent which is approved for idiopathic urticaria and angioedema.   10. Prednisone  - currently at 15 mg QD.   11. Cromolyn nebulizer was prescribed today. You can use it every 4-6 hours as needed for symptoms.   12. Discussed ordering prescription strength ketotifen. Please let us know once you find a compound pharmacy.   12. For breakthrough symptoms: in addition to hydroxyzine, ketotifen 3-4 drops q4H PRN PO and in the eyes
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
## 2019-05-28 NOTE — Unmapped (Signed)
[facility] Clinical Assessment & Refill Coordination Note    Sandy Chandler, DOB: 14-Nov-1985  Phone: [phone] (home)     All above HIPAA information was verified with patient.     Specialty Medication(s):   General Specialty: Xolair     Current Outpatient Medications   Medication Sig Dispense Refill   ??? albuterol (PROVENTIL,VENTOLIN) 2 mg/5 mL syrup Take 5 mL (2 mg total) by mouth every six (6) hours as needed. for up to 30 doses 120 mL 0   ??? albuterol 2.5 mg/0.5 mL nebulizer solution Inhale 0.5 mL (2.5 mg total) by nebulization every four (4) hours as needed for wheezing or shortness of breath (couh). 30 each 12   ??? cromolyn (GASTROCROM) 100 mg/5 mL solution Take 10 mL (200 mg total) by mouth Four (4) times a day (before meals and nightly). 480 mL 11   ??? cromolyn (INTAL) 20 mg/2 mL nebulizer solution Inhale 2 mL (20 mg total) by nebulization every four (4) hours as needed for allergies for up to 280 doses. 80 mL 6   ??? cyanocobalamin 1,000 mcg/mL injection Inject 1,000 mcg into the muscle.     ??? desvenlafaxine (PRISTIQ) 50 MG 24 hr tablet Take by mouth.     ??? diphenhydrAMINE (BENADRYL) 50 mg capsule Take 50 mg by mouth.     ??? doxepin (SINEQUAN) 10 mg/mL solution Take 2 mL (20 mg total) by mouth three (3) times a day (at 6am, noon and 6pm).     ??? dronabinol (MARINOL) 10 MG capsule take 1 capsule by mouth twice a day before meals prn  0   ??? empty container (SHARPS CONTAINER) Misc use as directed 1 each 2   ??? enoxaparin (LOVENOX) 80 mg/0.8 mL injection Inject 0.8 mL (80 mg total) under the skin every twelve (12) hours. 60 Syringe 0   ??? EPINEPHrine (EPIPEN) 0.3 mg/0.3 mL injection Inject 0.3 mL (0.3 mg total) into the muscle once as needed for anaphylaxis for up to 1 dose. 2 each 4   ??? EPIPEN 2-PAK 0.3 mg/0.3 mL injection Inject 0.3 mL (0.3 mg total) into the muscle once as needed for anaphylaxis for up to 1 dose. 2 Device 6 ??? esomeprazole (NEXIUM) 40 MG capsule Take 40 mg by mouth every morning before breakfast.     ??? famotidine (PEPCID) 20 MG tablet TAKE 1 TABLET (20 MG TOTAL) BY MOUTH TWO (2) TIMES A DAY. 180 tablet 3   ??? famotidine (PEPCID) 40 MG tablet Take 1 tablet (40 mg total) by mouth Two (2) times a day. 60 tablet 0   ??? fexofenadine (ALLEGRA) 180 MG tablet Take 2 tablets (360 mg total) by mouth Two (2) times a day. (Patient not taking: Reported on 05/14/2019) 30 tablet 12   ??? fluticasone (FLONASE) 50 mcg/actuation nasal spray 2 sprays by Each Nare route daily. 16 g 12   ??? fluticasone propionate (FLOVENT HFA) 44 mcg/actuation inhaler Inhale 2 puffs Two (2) times a day. With spacer. Please rinse out mouth or brush teeth after use.     ??? hydrocortisone 1 % cream Apply 1 application topically.     ??? hydroxychloroquine (PLAQUENIL) 200 mg tablet Take 1 tablet (200 mg total) by mouth Two (2) times a day. 60 tablet 11   ??? hydrOXYzine (VISTARIL) 100 MG capsule Take 1 capsule (100 mg total) by mouth nightly. 30 capsule 0   ??? hydrOXYzine (VISTARIL) 25 MG capsule Take 25 to 50 mg as needed for  MCAS flare or as pretreatment before exposure to triggers. 60 capsule 11   ??? ketotifen (ZADITOR) 0.025 % (0.035 %) ophthalmic solution Put 3 drops into a small amount of water or fluid and take by mouth up to 4 times a day as needed. 5 mL 0   ??? levonorgestrel (MIRENA) 20 mcg/24 hours (5 yrs) 52 mg IUD 1 each by Intrauterine route once.     ??? linaclotide (LINZESS) 145 mcg capsule Take 290 mcg by mouth.     ??? LORazepam (ATIVAN) 0.5 MG tablet Place 0.5 mg under the tongue.     ??? midodrine (PROAMATINE) 2.5 MG tablet Take 2.5 mg by mouth.     ??? mirtazapine (REMERON) 15 MG tablet Take 15 mg by mouth nightly.      ??? montelukast (SINGULAIR) 5 MG chewable tablet CHEW TWO TABLETS BY MOUTH NIGHTLY 60 tablet 11   ??? nebulizer and compressor (VIOS AEROSOL DELIVERY SYSTEM) Devi 1 each by Miscellaneous route every six (6) hours as needed. 1 each 1 ??? olopatadine 0.2 % ophthalmic solution Administer 1 drop to both eyes daily as needed. 5 mL 2   ??? omalizumab (XOLAIR) 150 mg/mL injection Inject 2 mL (300 mg total) under the skin every fourteen (14) days. 4 mL 12   ??? ondansetron (ZOFRAN-ODT) 4 MG disintegrating tablet Take 4 mg by mouth every six (6) hours as needed.     ??? prednisoLONE (ORAPRED) 15 mg/5 mL (3 mg/mL) solution Take 15 mg by mouth daily at 0600.     ??? predniSONE (DELTASONE) 10 MG tablet Take 10 mg by mouth.     ??? pregabalin (LYRICA) 25 MG capsule Take by mouth.     ??? propranolol (INDERAL) 10 MG tablet Take 10 mg by mouth Three (3) times a day.     ??? sodium bicarb-sodium chloride (AYR SALINE NASAL NETI RINSE) nasal packet 1 packet by Each Nare route Two (2) times a day. Rinse sinuses before using Flonase or Astepro nasal sprays.  0   ??? sucralfate (CARAFATE) 1 gram tablet Take 1 g by mouth 4 (four) times a day as needed.     ??? tapentadol (NUCYNTA ER) 50 mg Tb12 Take 50 mg by mouth two (2) times a day.     ??? tapentadol (NUCYNTA) 75 mg tablet Take 75 mg by mouth 4 (four) times a day as needed.     ??? tiZANidine (ZANAFLEX) 4 MG tablet Take 2-4 mg by mouth.     ??? traMADoL (ULTRAM) 50 mg tablet Take 50 mg by mouth every six (6) hours as needed.     ??? VENTOLIN HFA 90 mcg/actuation inhaler INHALE TWO PUFFS EVERY SIX (6) HOURS AS NEEDED FOR WHEEZING. WITH SPACER 18 Inhaler 6     Current Facility-Administered Medications   Medication Dose Route Frequency Provider Last Rate Last Dose   ??? omalizumab [name] injection 300 mg  300 mg Subcutaneous Q28 Days Gurney Maxin, MD   300 mg at 05/02/18 1504        Changes to medications: [name] reports no changes at this time.    Allergies   Allergen Reactions   ??? Haloperidol Other (See Comments)     dystonias  Dystonia   ??? Naltrexone Anaphylaxis     First dose of ultra low dose naltrexone (Nov. 2016)   ??? Pumpkin Seed Hives, Itching and Shortness Of Breath   ??? Quetiapine Other (See Comments) Hypotension requiring transfer from [facility] to OSH ICU (with 400 mg)   ???  Reglan [Metoclopramide Hcl] Other (See Comments)     Facial tics/spasms, ?tardive dyskinesia   ??? Morphine    ??? Adhesive Tape-Silicones Rash   ??? Almond Other (See Comments)     Unknown   ??? Latex Rash   ??? Olanzapine Other (See Comments)     Dystonia  akathisia   ??? Prochlorperazine Other (See Comments)     Dystonia  dystonias   ??? Promethazine Other (See Comments)     akathisia & dystonias  Dystonia   ??? Silver Rash     tegaderm patch not mesh   ??? Tree Nut Other (See Comments)     Walnuts and Pecans, almonds       Changes to allergies: No    SPECIALTY MEDICATION ADHERENCE     Xolair 150 mg/ml: 0 days of medicine on hand     Specialty medication(s) dose(s) confirmed: Regimen is correct and unchanged.     Are there any concerns with adherence? No    Adherence counseling provided? Not needed    CLINICAL MANAGEMENT AND INTERVENTION      Clinical Benefit Assessment:    Do you feel the medicine is effective or helping your condition? Yes    Clinical Benefit counseling provided? Not needed    Adverse Effects Assessment:    Are you experiencing any side effects? No    Are you experiencing difficulty administering your medicine? No    Quality of Life Assessment:    How many days over the past month did your asthma/allergy  keep you from your normal activities? For example, brushing your teeth or getting up in the morning. 0    Have you discussed this with your provider? Not needed    Therapy Appropriateness:    Is therapy appropriate? Yes, therapy is appropriate and should be continued    DISEASE/MEDICATION-SPECIFIC INFORMATION      For patients on injectable medications: Patient currently has 1 doses left.  Next injection is scheduled for 11/06-11/09.    PATIENT SPECIFIC NEEDS     ? Does the patient have any physical, cognitive, or cultural barriers? No    ? Is the patient high risk? No     ? Does the patient require a Care Management Plan? No ? Does the patient require physician intervention or other additional services (i.e. nutrition, smoking cessation, social work)? No      SHIPPING     Specialty Medication(s) to be Shipped:   General Specialty: xolair    Other medication(s) to be shipped: none     Changes to insurance: No    Delivery Scheduled: Yes, Expected medication delivery date: 06/11/19.     Medication will be delivered via Same Day Courier to the confirmed prescription address in [facility].    The patient will receive a drug information handout for each medication shipped and additional FDA Medication Guides as required.  Verified that patient has previously received a Conservation officer, historic buildings.    All of the patient's questions and concerns have been addressed.    Breck Coons [facility] Specialty Pharmacist

## 2019-06-11 MED FILL — XOLAIR 150 MG/ML SUBCUTANEOUS SYRINGE: SUBCUTANEOUS | 28 days supply | Qty: 4 | Fill #2

## 2019-06-11 MED FILL — XOLAIR 150 MG/ML SUBCUTANEOUS SYRINGE: 28 days supply | Qty: 4 | Fill #2 | Status: AC

## 2019-07-13 ENCOUNTER — Emergency Department: Payer: Medicare Other

## 2019-07-13 ENCOUNTER — Other Ambulatory Visit: Payer: Self-pay

## 2019-07-13 ENCOUNTER — Emergency Department
Admission: EM | Admit: 2019-07-13 | Discharge: 2019-07-13 | Disposition: A | Discharge status: Home / Self Care | Payer: Medicare Other | Attending: Emergency Medicine | Admitting: Emergency Medicine

## 2019-07-13 DIAGNOSIS — Z79899 Other long term (current) drug therapy: Secondary | ICD-10-CM | POA: Insufficient documentation

## 2019-07-13 DIAGNOSIS — I10 Essential (primary) hypertension: Secondary | ICD-10-CM | POA: Insufficient documentation

## 2019-07-13 DIAGNOSIS — Q7961 Classical Ehlers-Danlos syndrome: Secondary | ICD-10-CM | POA: Diagnosis not present

## 2019-07-13 DIAGNOSIS — M47812 Spondylosis without myelopathy or radiculopathy, cervical region: Secondary | ICD-10-CM

## 2019-07-13 DIAGNOSIS — Z87891 Personal history of nicotine dependence: Secondary | ICD-10-CM | POA: Insufficient documentation

## 2019-07-13 DIAGNOSIS — R202 Paresthesia of skin: Secondary | ICD-10-CM | POA: Insufficient documentation

## 2019-07-13 DIAGNOSIS — W010XXA Fall on same level from slipping, tripping and stumbling without subsequent striking against object, initial encounter: Secondary | ICD-10-CM | POA: Diagnosis not present

## 2019-07-13 DIAGNOSIS — Z7901 Long term (current) use of anticoagulants: Secondary | ICD-10-CM | POA: Diagnosis not present

## 2019-07-13 DIAGNOSIS — J45909 Unspecified asthma, uncomplicated: Secondary | ICD-10-CM | POA: Insufficient documentation

## 2019-07-13 LAB — URINALYSIS, COMPLETE (UACMP) WITH MICROSCOPIC
Bacteria, UA: NONE SEEN
Bilirubin Urine: NEGATIVE
Glucose, UA: NEGATIVE mg/dL
Hgb urine dipstick: NEGATIVE
Ketones, ur: NEGATIVE mg/dL
Nitrite: NEGATIVE
Protein, ur: 30 mg/dL — AB
Specific Gravity, Urine: 1.019 (ref 1.005–1.030)
pH: 5 (ref 5.0–8.0)

## 2019-07-13 LAB — POCT PREGNANCY, URINE: Preg Test, Ur: NEGATIVE

## 2019-07-13 LAB — POC URINE PREG, ED: Preg Test, Ur: NEGATIVE

## 2019-07-13 MED ORDER — KETOROLAC TROMETHAMINE 60 MG/2ML IM SOLN
60.0000 mg | Freq: Once | INTRAMUSCULAR | Status: AC
  Administered 2019-07-13: 60 mg via INTRAMUSCULAR
  Filled 2019-07-13: qty 2

## 2019-07-13 MED ORDER — DIAZEPAM 5 MG PO TABS
5.0000 mg | ORAL_TABLET | Freq: Once | ORAL | Status: AC
  Administered 2019-07-13: 5 mg via ORAL
  Filled 2019-07-13: qty 1

## 2019-07-13 NOTE — ED Notes (Signed)
Report given to Raquel, RN  

## 2019-07-13 NOTE — ED Notes (Signed)
Pt transported for scans 

## 2019-07-13 NOTE — ED Notes (Signed)
Pt has had previous spinal surgery and is concerned about a pinched nerve or other abnormality- pt right hand shaking

## 2019-07-13 NOTE — Discharge Instructions (Addendum)
Your x-rays of the back were okay and do not show any acute injury to your spine.  Your MRI of the neck does not show any severe impingement or other spinal cord abnormalities.  Please follow-up with your pain management and neurosurgery doctors for continued monitoring of your symptoms.  Continue taking all your home medications to control the symptoms.

## 2019-07-13 NOTE — ED Provider Notes (Signed)
[facility] Emergency Department Provider Note  ____________________________________________  Time seen: Approximately 10:51 PM  I have reviewed the triage vital signs and the nursing notes.   HISTORY  Chief Complaint Numbness    HPI [name] is a 33 y.o. female with a history of asthma, Ehlers-Danlos syndrome, hypertension and prior ACDF who comes the ED  complaining of intermittent tremor and paresthesias of bilateral hands and forearms that started about 5:00 PM.  It is intermittent.  With that she feels that her hands are weaker, but no flaccidity.  She notes that she has had episodes like this in the past that sometimes just require her taking more of her tizanidine.  She has chronic pain and fibromyalgia and she sees doctors for this.  She also notes that she has some nonradiating moderate intensity left mid back pain that is worse with movement that started after a slip and fall in the mud today at 4 PM.  No head injury or significant new neck pain.     Past Medical History:  Diagnosis Date  . Asthma   . Collagen vascular disease ([place])   . Hypertension   . Renal insufficiency      Patient Active Problem List   Diagnosis Date Noted  . Urticaria 07/17/2018     Past Surgical History:  Procedure Laterality Date  . CERVICAL FUSION    . COLON SURGERY    . VASCULAR SURGERY       Prior to Admission medications   Medication Sig Start Date End Date Taking? Authorizing Provider  albuterol (PROVENTIL,VENTOLIN) 2 MG/5ML syrup Take 2 mg by mouth every 6 (six) hours as needed.    [provider]  cloNIDine (CATAPRES) 0.1 MG tablet Take 0.1 mg by mouth at bedtime as needed for sleep. 05/23/18   [provider]  cromolyn (GASTROCROM) 100 MG/5ML solution Take 100 mg by mouth 4 (four) times daily -  with meals and at bedtime. 02/19/18   [provider]  cyanocobalamin (,VITAMIN B-12,) 1000 MCG/ML injection Inject 1,000  mcg into the muscle every 30 (thirty) days.    [provider]  desvenlafaxine (PRISTIQ) 50 MG 24 hr tablet Take by mouth. 11/19/18   [provider]  dronabinol (MARINOL) 10 MG capsule Take 10 mg by mouth 2 (two) times daily before a meal. 10/24/15   [provider]  enoxaparin (LOVENOX) 80 MG/0.8ML injection Inject 80 mg into the skin every 12 (twelve) hours. 03/28/18   [provider]  EPINEPHrine 0.3 mg/0.3 mL IJ SOAJ injection Inject 0.3 mg into the muscle as needed for anaphylaxis.    [provider]  esomeprazole (NEXIUM) 40 MG capsule Take 40 mg by mouth daily before breakfast. 05/03/18   [provider]  fexofenadine (ALLEGRA) 180 MG tablet Take 180 mg by mouth daily as needed for allergies.    [provider]  fluconazole (DIFLUCAN) 150 MG tablet Take 1 tablet (150 mg total) by mouth every 3 (three) days. For three doses 12/02/18   Anyanwu, Sallyanne Havers, MD  fluticasone (FLOVENT HFA) 44 MCG/ACT inhaler Inhale 2 puffs into the lungs 2 (two) times daily. 10/22/17   [provider]  gabapentin (NEURONTIN) 100 MG capsule Take 100 mg by mouth 3 (three) times daily as needed for pain. 03/07/18 03/07/19  [provider]  hydroxychloroquine (PLAQUENIL) 200 MG tablet Take 200 mg by mouth 2 (two) times daily.  07/15/19  [provider]  hydrOXYzine (VISTARIL) 25 MG capsule  Take 25 mg by mouth 4 (four) times daily as needed for itching. 12/05/17   [provider]  ketotifen (ZADITOR) 0.025 % ophthalmic solution Take 3 drops by mouth See admin instructions. Use 3 drops in small amount of water and take up to 4 times daily as needed    [provider]  linaclotide (LINZESS) 145 MCG CAPS capsule Take 145-290 mcg by mouth daily as needed for constipation.    [provider]  LORazepam (ATIVAN) 0.5 MG tablet Place 0.5 mg under the tongue daily as needed for nausea.    [provider]  midodrine  (PROAMATINE) 2.5 MG tablet Take 2.5 mg by mouth 3 (three) times daily as needed (hypotension).    [provider]  montelukast (SINGULAIR) 5 MG chewable tablet Chew 10 mg by mouth every evening.    [provider]  omalizumab Arvid Right) 150 MG injection Inject 300 mg into the skin every 21 ( twenty-one) days.    [provider]  predniSONE (DELTASONE) 10 MG tablet Take 1 tablet (10 mg total) by mouth daily with breakfast. 60 mg PO (ORAL) x 2 days 50 mg PO (ORAL)  x 2 days 40 mg PO (ORAL)  x 2 days 30 mg PO  (ORAL)  x 2 days 20 mg PO  (ORAL) x 2 days 10 mg PO  (ORAL) x 2 days then stop 07/18/18   Bettey Costa, MD  propranolol (INDERAL) 10 MG tablet Take 10 mg by mouth See admin instructions. Take 1 tablet (10MG ) every 8 hours and as needed for heart rate >140 03/13/18   [provider]  sucralfate (CARAFATE) 1 g tablet Take 1 g by mouth 4 (four) times daily as needed.    [provider]  tapentadol HCl (NUCYNTA) 75 MG tablet Take 75 mg by mouth every 4 (four) hours as needed for pain. 06/12/18   [provider]     Allergies Compazine [prochlorperazine], Morphine and related, Phenergan [promethazine], Reglan [metoclopramide], and Vicodin [hydrocodone-acetaminophen]   No family history on file.  Social History Social History   Tobacco Use  . Smoking status: Former Research scientist (life sciences)  . Smokeless tobacco: Never Used  Substance Use Topics  . Alcohol use: Yes    Comment: occasional  . Drug use: Yes    Types: Marijuana    Review of Systems  Constitutional:   No fever or chills.  ENT:   No sore throat. No rhinorrhea. Cardiovascular:   No chest pain or syncope. Respiratory:   No dyspnea or cough. Gastrointestinal:   Negative for abdominal pain, vomiting and diarrhea.  Musculoskeletal:   Left mid back pain as above All other systems reviewed and are negative except as documented above in ROS and  HPI.  ____________________________________________   PHYSICAL EXAM:  VITAL SIGNS: ED Triage Vitals  Enc Vitals Group     BP 07/13/19 1930 (!) 171/101     Pulse Rate 07/13/19 1930 (!) 125     Resp 07/13/19 1930 18     Temp 07/13/19 1930 99 F (37.2 C)     Temp Source 07/13/19 1930 Oral     SpO2 07/13/19 1930 98 %     Weight 07/13/19 1931 235 lb (106.6 kg)     Height 07/13/19 1931 5\' 6"  (1.676 m)     Head Circumference --      Peak Flow --      Pain Score 07/13/19 1931 6     Pain Loc --  Pain Edu? --      Excl. in [place]? --     Vital signs reviewed, nursing assessments reviewed.   Constitutional:   Alert and oriented. Non-toxic appearance. Eyes:   Conjunctivae are normal. EOMI. PERRL. ENT      Head:   Normocephalic and atraumatic.      Nose:   Wearing a mask.      Mouth/Throat:   Wearing a mask.      Neck:   No meningismus. Full ROM. Hematological/Lymphatic/Immunilogical:   No cervical lymphadenopathy. Cardiovascular:   RRR. Symmetric bilateral radial and DP pulses.  No murmurs. Cap refill less than 2 seconds. Respiratory:   Normal respiratory effort without tachypnea/retractions. Breath sounds are clear and equal bilaterally. No wheezes/rales/rhonchi. Gastrointestinal:   Soft and nontender. Non distended. There is no CVA tenderness.  No rebound, rigidity, or guarding.  Musculoskeletal:   Normal range of motion in all extremities. No joint effusions.  No lower extremity tenderness.  No edema.  No midline spinal tenderness.  There is tenderness inferior to the scapular spine on the left which reproduces the back pain. Neurologic:   Normal speech and language.  Motor grossly intact.  Symmetric strength. Subjectively diminished sensation on left lateral forearm and right medial forearm.  Intact lumbrical function. Reflexes symmetric. No acute focal neurologic deficits are appreciated.  Skin:    Skin is warm, dry and intact. No rash noted.  No petechiae, purpura, or  bullae.  ____________________________________________    LABS (pertinent positives/negatives) (all labs ordered are listed, but only abnormal results are displayed) Labs Reviewed  URINALYSIS, COMPLETE (UACMP) WITH MICROSCOPIC - Abnormal; Notable for the following components:      Result Value   Color, Urine YELLOW (*)    APPearance HAZY (*)    Protein, ur 30 (*)    Leukocytes,Ua TRACE (*)    All other components within normal limits  POC URINE PREG, ED  POCT PREGNANCY, URINE   ____________________________________________   EKG    ____________________________________________    RADIOLOGY  DG Thoracic Spine 2 View  Result Date: 07/13/2019 CLINICAL DATA:  Fall today.  Thoracic back pain.  Initial encounter. EXAM: THORACIC SPINE 2 VIEWS COMPARISON:  None. FINDINGS: No evidence of thoracic spine fracture subluxation. Intervertebral disc spaces are maintained. No lytic or sclerotic bone lesions identified. Mild thoracic dextroscoliosis noted. Cervical spine fusion hardware noted at C4-5. IMPRESSION: No acute findings. Mild thoracic dextroscoliosis. Electronically Signed   By: Marlaine Hind M.D.   On: 07/13/2019 21:24   DG Lumbar Spine 2-3 Views  Result Date: 07/13/2019 CLINICAL DATA:  Fall today.  Low back pain.  Initial encounter. EXAM: LUMBAR SPINE - 2-3 VIEW COMPARISON:  None. FINDINGS: No evidence of lumbar spine fracture or subluxation. Four non-rib-bearing lumbar vertebra are noted with transitional vertebra at S5. Intervertebral disc spaces are maintained. No other osseous abnormality identified. IMPRESSION: No acute findings or significant abnormality. Electronically Signed   By: Marlaine Hind M.D.   On: 07/13/2019 21:26   MR Cervical Spine Wo Contrast  Result Date: 07/13/2019 CLINICAL DATA:  Prior C-spine surgery, numbness in the left arm and leg EXAM: MRI CERVICAL SPINE WITHOUT CONTRAST TECHNIQUE: Multiplanar, multisequence MR imaging of the cervical spine was performed.  No intravenous contrast was administered. COMPARISON:  None. FINDINGS: Alignment: There is straightening of the normal cervical lordosis. Vertebrae: The patient is status post ACDF at C4-C5 with surrounding metallic artifact. The vertebral body heights are well maintained. No fracture, marrow edema,or pathologic marrow infiltration.  Cord: Normal signal and morphology. Posterior Fossa, vertebral arteries, paraspinal tissues: The visualized portion of the posterior fossa is unremarkable. Normal flow voids seen within the vertebral arteries. The paraspinal soft tissues are unremarkable. Disc levels: C1-C2: Atlanto-axial junction is normal, without canal narrowing C2-C3: There is a minimal disc osteophyte complex, however no significant canal or neural foraminal narrowing. C3-C4: No significant spinal canal or neural foraminal narrowing C4-C5: There is a small disc osteophyte complex which causes mild left neural foraminal narrowing. C5-C6: There is a minimal disc osteophyte complex with a right lateral recess disc protrusion which contacts the exiting right C6 nerve root without impingement. There is mild right neural foraminal narrowing and mild central canal effacement. C6-C7: No significant spinal canal or neural foraminal narrowing C7-T1: No significant spinal canal or neural foraminal narrowing IMPRESSION: Status post ACDF at C4-C5. Cervical spine spondylosis most notable at C5-C6 with a small right lateral recess disc protrusion which contacts the exiting right C6 nerve root without impingement and mild right neural foraminal narrowing. Electronically Signed   By: Prudencio Pair M.D.   On: 07/13/2019 22:00    ____________________________________________   PROCEDURES Procedures  ____________________________________________  DIFFERENTIAL DIAGNOSIS   Central cord syndrome, transverse myelitis, syringomyelia, cervical vertebral compression fracture  CLINICAL IMPRESSION / ASSESSMENT AND PLAN / ED  COURSE  Medications ordered in the ED: Medications  diazepam (VALIUM) tablet 5 mg (5 mg Oral Given 07/13/19 2010)  ketorolac (TORADOL) injection 60 mg (60 mg Intramuscular Given 07/13/19 2010)    Pertinent labs & imaging results that were available during my care of the patient were reviewed by me and considered in my medical decision making (see chart for details).  [name] was evaluated in Emergency Department on 07/13/2019 for the symptoms described in the history of present illness. She was evaluated in the context of the global COVID-19 pandemic, which necessitated consideration that the patient might be at risk for infection with the SARS-CoV-2 virus that causes COVID-19. Institutional protocols and algorithms that pertain to the evaluation of patients at risk for COVID-19 are in a state of rapid change based on information released by regulatory bodies including the CDC and federal and state organizations. These policies and algorithms were followed during the patient's care in the ED.   Patient presents with back pain as well as upper extremity paresthesias starting today, worse after a mechanical fall in mud.  Will check x-rays of the lumbar and thoracic spine.  Will MRI of the cervical spine given her complicated history and chronic symptoms which make it difficult to ascertain acute from chronic pathology.  Neurologic exam is nonfocal  Clinical Course as of Jul 13 2303  Sun Jul 13, 2019  2139 X-rays of thoracic and lumbar spine unremarkable.  Awaiting for MRI of the C-spine.   [PS]    Clinical Course User Index [PS] Carrie Mew, MD     ----------------------------------------- 11:03 PM on 07/13/2019 -----------------------------------------  MRI unremarkable, show some spondylosis but overall no acute findings.  Recommend follow-up with her primary care doctor, pain management, neurosurgery.  She has tizanidine at home which she can take for the symptoms as as  well as other medications for her pain regimen.  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  Dictation errors may occur despite best attempts at proofreading.   Carrie Mew, MD 07/13/19 [phone]

## 2019-07-13 NOTE — ED Triage Notes (Signed)
Patient reports symptoms started around 5 pm.  Patient reports spinal surgery in 2019.  Tonight had pain with numbness in left arm and right leg.  Concerned might be related to continued spinal issues.

## 2019-07-13 NOTE — ED Notes (Signed)
Pt aware of need for urine sample and cup placed by toilet

## 2019-07-13 NOTE — ED Notes (Signed)
Pt transported to MRI 

## 2019-07-15 NOTE — Unmapped (Signed)
[facility] has made a third and final attempt to reach this patient to refill the following medication: XOLAIR.      We have left voicemails on the following phone numbers: [phone]  and have been unable to leave messages on the following phone numbers: [phone].    Dates contacted: 12/10  12/16  12/22   Last scheduled delivery: 11/18    The patient may be at risk of non-compliance with this medication. The patient should call the [facility] at [phone] (option 4) to refill medication.    Print production planner

## 2019-07-21 NOTE — Unmapped (Signed)
[facility] Refill Coordination Note    Specialty Medication(s) to be Shipped:   General Specialty: Xolair    Other medication(s) to be shipped: none     Sandy Chandler, DOB: 10/14/1985  Phone: [phone] (home)       All above HIPAA information was verified with patient.     Was a Nurse, learning disability used for this call? No    Completed refill call assessment today to schedule patient's medication shipment from the [facility] [phone]).       Specialty medication(s) and dose(s) confirmed: Regimen is correct and unchanged.   Changes to medications: [place] reports no changes at this time.  Changes to insurance: No  Questions for the pharmacist: No    Confirmed patient received Welcome Packet with first shipment. The patient will receive a drug information handout for each medication shipped and additional FDA Medication Guides as required.       DISEASE/MEDICATION-SPECIFIC INFORMATION        N/A    SPECIALTY MEDICATION ADHERENCE            Xolair: 0 days worth of medication on hand. Dose Due: 07/26/19        SHIPPING     Shipping address confirmed in Epic.     Delivery Scheduled: Yes, Expected medication delivery date: 07/23/19.     Medication will be delivered via Same Day Courier to the prescription address in Epic WAM.    [place] A Lashonne Shull   [facility] Specialty Technician

## 2019-07-23 MED FILL — XOLAIR 150 MG/ML SUBCUTANEOUS SYRINGE: SUBCUTANEOUS | 28 days supply | Qty: 4 | Fill #3

## 2019-07-23 MED FILL — XOLAIR 150 MG/ML SUBCUTANEOUS SYRINGE: 28 days supply | Qty: 4 | Fill #3 | Status: AC

## 2019-08-01 ENCOUNTER — Telehealth
Admit: 2019-08-01 | Discharge: 2019-08-02 | Payer: MEDICARE | Attending: Allergy & Immunology | Primary: Allergy & Immunology

## 2019-08-02 MED ORDER — SODIUM CHLORIDE 0.9 % FOR NEBULIZATION
Freq: Four times a day (QID) | RESPIRATORY_TRACT | 12 refills | 8 days | Status: CP | PRN
Start: 2019-08-02 — End: 2020-08-01

## 2019-08-02 MED ORDER — DUPILUMAB 300 MG/2 ML SUBCUTANEOUS PEN INJECTOR
0 refills | 0 days
Start: 2019-08-02 — End: ?

## 2019-08-02 NOTE — Unmapped (Signed)
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 approved, we will discontinue Xolair.    Dysautonomia - I appreciate the careful management of the [facility].  She cannot take mestinon because it dropped her heart rate into the 30s.  She is on glycopyrrolate instead to help reduce the constant sweating and hot flashes from her dysautonomia.  She is doing very well with this medication.      ICD-10-CM   1. Moderate persistent asthma without complication  J45.40   2. Chronic allergic rhinitis  J30.9   3. 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 Postural orthostatic tachycardia syndrome  I49.8   6. Elevated serum tryptase  R74.8   7. Dysautonomia (CMS-HCC)  G90.1   8. Chronic mucus hypersecretion, respiratory  J39.8   9. Skin yeast infection  B37.2   10. Delayed wound healing  T14.8XXD   11. Recurrent infection of skin  L08.9   12. Other specified disorders involving the immune mechanism, not elsewhere classified (CMS-HCC)   D89.89   13. Encounter for immunization   Z23        Plan:    As described above    I will need to re-enter the orders for the PPSV23 and influenza vaccinations on the day the patient presents to clinic.    Orders this encounter:  Orders Placed This Encounter   Procedures   ??? Pneumococcal Polysaccharide 23-valent SQ/IM (Pneumovax 23)   ??? INFLUENZA VACCINE (QUAD) IM -  6 MO-ADULT - PF   ??? CBC w/ Differential   ??? Mitogens Stimulation   ??? Referral Laboratory Test, Other   ??? Immunoglobulin G (IgG)   ??? Immunoglobulin A (IgA)   ??? Immunoglobulin M (IgM)   ??? IgE Total   ??? Neutrophil Oxidative Burst (DHR)   ??? Pneumococcal Antibodies (23-valent)        Discharge Medications:    Requested Prescriptions     Signed Prescriptions Disp Refills   ??? dupilumab 300 mg/2 mL PnIj  0     Sig: 600 mg x 1 dose subcutaneously.  Then 300 mg subcutaneously every 14 days.   ??? sodium chloride 0.9 % nebulizer solution 90 mL 12     Sig: Inhale 3 mL by nebulization 4 (four) times a day as needed for wheezing.       Follow-up:  Return in about 3 months (around 10/30/2019) for Recheck asthma and bronchitis symptoms on Dupixent.     I spent 30 minutes on the real-time audio and video with the patient. I spent an additional 15 minutes on pre- and post-visit activities.     The patient was physically located in [place] or a state in which I am permitted to provide care. The patient and/or parent/guardian understood that s/he may incur co-pays and cost sharing, and agreed to the telemedicine visit. The visit was reasonable and appropriate under the circumstances given the patient's presentation at the time.    The patient and/or parent/guardian has been advised of the potential risks and limitations of this mode of treatment (including, but not limited to, the absence of in-person examination) and has agreed to be treated using telemedicine. The patient's/patient's family's questions regarding telemedicine have been answered.     If the visit was completed in an ambulatory setting, the patient and/or parent/guardian has also been advised to contact their provider???s office for worsening conditions, and seek emergency medical treatment and/or call 911 if the patient deems either necessary.    This patient was examined and discussed with the attending Dr. Hoy Finlay who agrees with the assessment and plan.     Harvie Junior, MD PhD  Assistant Professor of Medicine  Division of Rheumatology, Allergy & Immunology  [place] [facility] [place] [facility]

## 2019-08-03 NOTE — Unmapped (Addendum)
It was a pleasure seeing you today.  You saw Dr. Hoy Finlay (rhymes with Naoma Diener).    If you have any non-urgent questions or concerns, you can contact me (Dr. Hoy Finlay) phone at [phone];  by fax at [phone]; by email at [email or web address]; or via MyChart ([email or web address]).     For urgent concerns, please contact the [facility] Allergy/Immunology doctor on call at [phone].    [email or web address]    We discussed:    -Please come to [facility] Building on [address] at your convenience to get your blood drawn at the lab.  All the labs we are checking are listed below.  All the results should be visible in MyChart.  If you can't see any of the results, just ask me and I will send them to you in a different format.    - Call to make an appointment with nurses at [facility] so that you can get your pneumonia vaccine and your influenza vaccine for the 2020-2021 season. After you get your pneumonia vaccine, let me know and we will repeat your blood work 8 weeks after that pneumonia vaccine to make sure that your body made the appropriate immune response to the pneumonia vaccine.    - I will send you the paperwork that you need to fill out for [place] to start the prior authorization process for dupixent.      ICD-10-CM   1. Moderate persistent asthma without complication  J45.40   2. Chronic allergic rhinitis  J30.9   3. 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 Postural orthostatic tachycardia syndrome  I49.8   6. Elevated serum tryptase  R74.8   7. Dysautonomia (CMS-HCC)  G90.1   8. Chronic mucus hypersecretion, respiratory  J39.8   9. Skin yeast infection  B37.2   10. Delayed wound healing  T14.8XXD   11. Recurrent infection of skin  L08.9   12. Other specified disorders involving the immune mechanism, not elsewhere classified (CMS-HCC)   D89.89   13. Encounter for immunization   Z23       Orders Placed This Encounter   Procedures   ??? Pneumococcal Polysaccharide 23-valent SQ/IM (Pneumovax 23)   ??? INFLUENZA VACCINE (QUAD) IM - 6 MO-ADULT - PF   ??? CBC w/ Differential   ??? Mitogens Stimulation   ??? Referral Laboratory Test, Other   ??? Immunoglobulin G (IgG)   ??? Immunoglobulin A (IgA)   ??? Immunoglobulin M (IgM)   ??? IgE Total   ??? Neutrophil Oxidative Burst (DHR)   ??? Pneumococcal Antibodies (23-valent)       Requested Prescriptions     Signed Prescriptions Disp Refills   ??? dupilumab 300 mg/2 mL PnIj  0     Sig: 600 mg x 1 dose subcutaneously.  Then 300 mg subcutaneously every 14 days.   ??? sodium chloride 0.9 % nebulizer solution 90 mL 12     Sig: Inhale 3 mL by nebulization 4 (four) times a day as needed for wheezing.       Return in about 3 months (around 10/30/2019) for Recheck asthma and bronchitis symptoms on Dupixent.     ASSESSMENT AND PLAN    Assessment:    Sandy Chandler is a 34 y.o. 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  She has to remain on a certain amount of corticosteroid now because of concern of secondary adrenal insufficiency.  With regards to the wound healing, there is also likely some contribution from her EDS to her impaired wound healing. It is less likely that her yeast infection could be due to impaired neutrophil oxidative burst, or impaired Th17 responses, though I will keep this on my differential if there is another episode of severe or persistent yeast infection.  - To re-work up her immune function we will check a:  - CBC with differential   - Consider CGD oxidative burst assay  - total IgG, IgA, IgM, IgE (recognizing that total IgE will be difficult to interpret in a person actively on Xolair)  - Measure specific antibody titers to tetanus and diptheria and PPSV23 vaccination  - Patient is due for repeat PPSV23 vaccination (as well as an influenza shot), so we can do a diagnostic vaccination with PPSV23and recheck antibody titers in response ~8-10 weeks after this.   - Lymphocyte enumeration studies - send out to [facility] so that we can accurately compare these results to those from 92017. (Flow cytometry for CD3, CD4, CD8 T cell subsets, B cells CD19 and C20; NK cells - CD16/ CD56; CD11/CD18 and CD15a)  - Memory B cell subsets flow cytometry (looking at CD27)  - In vitro T cell functional assessment with T cell response to phytohemagluttinin (PHA)  - Assess In vitro T and B cell functional assessment by measuring B cell proliferation following pokeweed mitogen.    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  We will continue with the regimen listed below, but we did discuss a switch from Xolair 300 mg Q 2weeks, which has controlled to a moderate extent her asthma and CIU/CSUA and flushing, to Dupixent.  With its different mechanism of action, it may result in even better control of her asthma that may allow her to wean off of prednisone and switch to hydrocortisone and she may also have better control of allergic rhinosinusitis that is causing significant mucus secretion.    1. Doxepin 10 mg TID   2. famotidine 40 mg BID    3. Zyrtec 10 mg BID  4. She did increase Gastrocrom from 100 to 200 mg QID ACHS and this did help with symptom control (She still periodically uses it topically on skin for itching and redness)   5. Montelukast 10 mg QD  6. Continues on hydroxychloroquine 200 mg BID  7. Nasalcrom 4% 2 sprays each nostril BID to TID PRN (5.2 mg/spray); White petrolatum jelly PRN -- has not had to needed recently  8. Hydroxyzine 100 mg QHS and 25-50mg   Q6hours as needed for MCAS flare and as pre-treatment before exposure to known triggers.   9. Omalizumab 300 mg q2 weeks at [facility], but she is interested in discussing a switch to Dupixent.  10. Prednisone  - currently at 15 mg QD.   11. She did try it to try to use nebulized cromolyn every 4 to 6 hours as needed to see if it would help with the excess mucus production and it was not that helpful.  She would be interested in trying nebulized normal saline to see if this will help relieve the excess airway mucus buildup.  12. She did fill her prescription strength ketotifen 1 mg BID and is taking this  13. For breakthrough symptoms: in addition to hydroxyzine, ketotifen 3-4 drops q4H PRN PO and in the eyes  14. Regarding symptoms attributed to dysautonomia -- She cannot take mestinon because it dropped her heart rate into the 30s.  She is on glycopyrrolate instead to help reduce the constant sweating and hot flashes from her dysautonomia.  She is doing very well with this medication.    We also discussed the risks and benefits of treating her asthma symptoms with dupilumab. The patient expressed understanding and would like to pursue this medication. We will send the patient the link to fill out forms for Dupixent and I will fill out forms for prior authorization of this medication. Once this medication is approved, we will discontinue Xolair.    Dysautonomia - I appreciate the careful management of the [facility].  She cannot take mestinon because it dropped her heart rate into the 30s.  She is on glycopyrrolate instead to help reduce the constant sweating and hot flashes from her dysautonomia.  She is doing very well with this medication.

## 2019-08-25 NOTE — Unmapped (Signed)
[facility] Refill Coordination Note    Specialty Medication(s) to be Shipped:   General Specialty: Xolair     Other medication(s) to be shipped: n/a     Sandy Chandler, DOB: Jul 13, 1986  Phone: [phone] (home)       All above HIPAA information was verified with patient.     Completed refill call assessment today to schedule patient's medication shipment from the [facility] [phone]).       Specialty medication(s) and dose(s) confirmed: Regimen is correct and unchanged.   Changes to medications: Sandy Chandler reports no changes reported at this time.  Changes to insurance: No  Questions for the pharmacist: No    Confirmed patient received Welcome Packet with first shipment. The patient will receive a drug information handout for each medication shipped and additional FDA Medication Guides as required.       DISEASE/MEDICATION-SPECIFIC INFORMATION        For patients on injectable medications: Patient currently has 3 doses left.  Next injection is scheduled for 08/26/19.    SPECIALTY MEDICATION ADHERENCE                Xolair 150 mg/ml: 10 days of medicine on hand         SHIPPING     Shipping address confirmed in Epic.     Delivery Scheduled: Yes, Expected medication delivery date: 08/28/19.     Medication will be delivered via UPS to the temporary address in Epic WAM.    Mong Neal Vangie Bicker   [facility] Specialty Pharmacist

## 2019-08-27 MED FILL — XOLAIR 150 MG/ML SUBCUTANEOUS SYRINGE: SUBCUTANEOUS | 28 days supply | Qty: 4 | Fill #4

## 2019-08-27 MED FILL — XOLAIR 150 MG/ML SUBCUTANEOUS SYRINGE: 28 days supply | Qty: 4 | Fill #4 | Status: AC

## 2019-09-14 DIAGNOSIS — D4709 Other mast cell neoplasms of uncertain behavior: Principal | ICD-10-CM

## 2019-09-14 DIAGNOSIS — L501 Idiopathic urticaria: Principal | ICD-10-CM

## 2019-09-14 DIAGNOSIS — D8940 Mast cell activation, unspecified: Principal | ICD-10-CM

## 2019-09-14 MED ORDER — EPINEPHRINE 0.3 MG/0.3 ML INJECTION, AUTO-INJECTOR
Freq: Once | INTRAMUSCULAR | 6 refills | 0 days | Status: CP | PRN
Start: 2019-09-14 — End: ?

## 2019-09-14 MED ORDER — PREDNISONE 10 MG TABLET
ORAL_TABLET | 0 refills | 0 days | Status: CP
Start: 2019-09-14 — End: ?

## 2019-09-15 NOTE — Unmapped (Signed)
Patient called and stating she would like to speak regarding reactions.

## 2019-09-15 NOTE — Unmapped (Signed)
Ms. Sandy Chandler paged at 4PM in 09/14/2019. She reports 8 days of face swelling, hives, diarrhea, nausea/vomiting concerning for an MCAS flare. Photos sent to Dr. Hoy Finlay visualized in the chart. She has maximized her baseline therapies and used epinephrine which helped her face swelling but it returned. She has not developed any respiratory, oral, or pharyngeal symptoms. She spoke very clearly and comfortably on the phone and is requesting any advice/intervention to help her avoid going to the emergency department. I prescribed prednisone 60 mg taper over 10 days to help with this flare, after which time she will continue back on her daily prednisone 15 mg. Refilled epi-pen as well. We discussed indications for going to the ED which include any oral, throat, respiratory, or neurologic symptoms. She agreed with this plan.    Sandy Chandler L. Thelma Barge, MD  Fellow, [facility] Allergy/Immunology

## 2019-09-15 NOTE — Unmapped (Addendum)
Pt called to clinic to notify us of an allergic reaction, emergency in nature. Notified staff to let her know she needs to call 911 if she is having an allergic reaction or anaphylaxis. Call made to pt. Phone range, but no one answered. VM left stating if she is in anaphylaxis or an extreme allergic reaction I need her to call 911 and head to the nearest ER. That is isn't safe to try and manage this at home without medical personnel present. Had spoken with pt last week advising her to go to the ER, but the pt refused. She had stated at that time that her symptoms had been building up.

## 2019-09-16 NOTE — Unmapped (Signed)
Received page to call back patient.  Tried calling phone number listed in page and on file on 4 occasions from 6:10 - 6:12 PM and each went straight to voicemail.  I have seen some difficulty getting connected with patient per records reviewed.  I will attempt once more and will be available via page should further issues arise.

## 2019-09-16 NOTE — Unmapped (Signed)
Received page from Ms. Ave Filter and spoke with her for 20 minutes.  She reports that she continues to have ongoing hives, swelling, flushing, and wheezing, change in vital signs, as well as gastrointestinal symptoms.  She started Prednisone 60 mg PO yesterday at 6 PM and had improvement in symptoms for about 6 hours after his dose, prior to worsening.  She had not had her Prednisone dose today by the time we spoke.  She is currently out of town in [place] and is seeking guidance on whether she needs to present to the ED.  She reports that she used Epi Pen today and had improvement in these symptoms after usage, though has worsened once more.      I agree with prior assessment from Cissy and from Dr. Thelma Barge and have told her that I cannot contradict the recommendations from these colleagues and that she strongly consider using the ED for comprehensive evaluation as medically this constellation of symptoms is quite concerning.  Additionally, our advice is to utilize the ED any time after she uses an Epi Pen to manage her symptoms.  I expect that she would receive a thorough medical evaluation including assessment of vital signs, close medical supervision, additional indicated treatments such as IV medication and/or IV fluids, and possible hospital admission.  However, she states her concerns are presenting to an outside ED with unfamiliarity with her medical history, especially in the setting of the COVID-19 pandemic.  In light of this expressed reluctance, through shared decision making I advised her to monitor vital signs at home (including blood pressure) and present should she have any notation of worsening symptoms including tachycardia, lightheadedness, or dizziness.  She should also split her Prednisone dose in half given her reported symptom improvement in just 6 hours after her first dose, and she should given this half-dose twice daily instead of once daily.  She can take a full 60 mg dose this evening and then start 30 mg PO BID with de-escalation as previously discussed, though with usage of half-doses twice daily instead of full doses once daily.  I believe that her corticosteroid course may need some additional time for maximal benefit, and I also wonder if real or perceived symptom improvement can be maximized with this twice daily regimen while keeping total daily dose the same as previously advised.  She may certainly further reach out tomorrow to arrange follow-up and any modifications to her long-term plan.  Patient is amenable with this plan.

## 2019-09-27 NOTE — Unmapped (Addendum)
[facility] Refill Coordination Note    Specialty Medication(s) to be Shipped:   General Specialty: Xolair    Other medication(s) to be shipped: n/a     [name], DOB: 1986-07-19  Phone: [phone] (home)       All above HIPAA information was verified with patient.     Was a Nurse, learning disability used for this call? No    Completed refill call assessment today to schedule patient's medication shipment from the [facility] [phone]).       Specialty medication(s) and dose(s) confirmed: Regimen is correct and unchanged.   Changes to medications: Tyrisha reports no changes at this time.  Changes to insurance: No  Questions for the pharmacist: No    Confirmed patient received Welcome Packet with first shipment. The patient will receive a drug information handout for each medication shipped and additional FDA Medication Guides as required.       DISEASE/MEDICATION-SPECIFIC INFORMATION        For patients on injectable medications: Patient currently has 0 doses left.  Next injection is scheduled for 3/9.    SPECIALTY MEDICATION ADHERENCE     Medication Adherence    Patient reported X missed doses in the last month: 0  Specialty Medication: Xolair  Patient is on additional specialty medications: No  Patient is on more than two specialty medications: No  Any gaps in refill history greater than 2 weeks in the last 3 months: no  Demonstrates understanding of importance of adherence: yes  Informant: patient                Xolair 150mg /ml: Patient has 0 days of medication on hand      SHIPPING     Shipping address confirmed in Epic.     Delivery Scheduled: Yes, Expected medication delivery date: 3/10.     Medication will be delivered via UPS to the temporary address in Epic WAM.    Olga Millers   [facility] Specialty Technician

## 2019-09-30 DIAGNOSIS — L501 Idiopathic urticaria: Principal | ICD-10-CM

## 2019-09-30 DIAGNOSIS — D8942 Idiopathic mast cell activation syndrome: Principal | ICD-10-CM

## 2019-09-30 MED ORDER — FAMOTIDINE 20 MG TABLET
ORAL_TABLET | Freq: Two times a day (BID) | ORAL | 5 refills | 90 days | Status: CP
Start: 2019-09-30 — End: 2020-12-11

## 2019-09-30 MED FILL — XOLAIR 150 MG/ML SUBCUTANEOUS SYRINGE: SUBCUTANEOUS | 28 days supply | Qty: 4 | Fill #5

## 2019-09-30 MED FILL — XOLAIR 150 MG/ML SUBCUTANEOUS SYRINGE: 28 days supply | Qty: 4 | Fill #5 | Status: AC

## 2019-09-30 NOTE — Unmapped (Signed)
Pt seen 07/2019. Refilled Pepcid.

## 2019-10-09 NOTE — Unmapped (Signed)
I received a page from Ms. Nyborg at 11:05 pm and spoke with her for 15 minutes. She received her first Pfizer vaccine this evening at 8:45 pm and reports having increased symptoms over the last hour including flushing, warm/erythema of her face, itchiness, tachycardia, and hypertension. Since this time, she has taken Benadryl 50 mg, Vistaril, Clonidine, and applied topical Cromolyn to her face. She reports no improvement in her symptoms. She denies trouble breathing.     Prior to paging, she checked her vitals and reported that she was hypertensive with blood pressure of 198/127. I had her recheck her vitals approximately 10 minutes into our conversation and her heart rate was in the 130's and BP was 182/140. She is currently out of town in [place].     Given her concerning vital signs, I advised that she presents to the ED for further evaluation as her history and present symptoms are concerning. I recommend vital sign monitoring, close medical supervision, and additional treatments as indicated such as IV medications.     She expressed understanding regarding my recommendation to go to the ED for further evaluation. In addition, she also reports that she will check her vital signs closely. We discussed that if she administers Epinephrine for her symptoms that she should also go to the ED for further evaluation. If she presents to the ED, she will have the medical professionals call me for further advice in regards to her complex history.     I told Ms. Ave Filter that I will relay this information to Dr. Hoy Finlay. She has an appointment scheduled with her on 4/9 and reports that she should receive her second vaccine on 4/8 and might need to change this to a telemedicine visit.    --  Gwenlyn Fudge, MD  [facility] Allergy & Immunology Fellow

## 2019-10-10 NOTE — Unmapped (Signed)
Patient spoke with Dr Alfredo Bach yesterday following an immune response from the Covid Vaccine. She was unable to start a Prednisone taper due to elevated BP & now that it is back WNL, asks if she could be re-evaluated & contacted regarding a weekend plan?

## 2019-10-12 NOTE — Unmapped (Signed)
I received a page from Sandy Chandler this evening. She reports having a flare of her mast cell symptoms. She started having diarrhea and facial flushing/warmth. She reports taking Benadryl 50 mg and hydroxyzine 50 mg one hour ago. She has had some relief in her symptoms. She wanted to know if she could take an extra dose of prednisone which usually helps her symptoms in a few hours. She is currently on prednisone 15 mg daily. I recommended taking an additional 5 mg of prednisone this evening and if needed she can take an additional 50 mg of Benadryl in 3-4 hours. [name] agrees with the plan and will contact me with further questions or concerns.    --  Gwenlyn Fudge, MD  [facility] Allergy & Immunology Fellow

## 2019-10-14 DIAGNOSIS — D8940 Mast cell activation, unspecified: Principal | ICD-10-CM

## 2019-10-14 DIAGNOSIS — D8949 Other mast cell activation disorder: Principal | ICD-10-CM

## 2019-10-14 MED ORDER — KETOTIFEN 0.025 % (0.035 %) EYE DROPS
0 refills | 0 days
Start: 2019-10-14 — End: ?

## 2019-10-14 MED ORDER — CETIRIZINE 10 MG TABLET
ORAL_TABLET | Freq: Every day | ORAL | 3 refills | 90 days | Status: CP
Start: 2019-10-14 — End: 2020-10-13

## 2019-10-14 NOTE — Unmapped (Signed)
Called pt. She agrees to see Mirian Capuchin, NP Wednesday for a 10am virtual visit. She is to keep her upcoming appt with Dr. Hoy Finlay in a few weeks. Pt also requesting Ketotifen Eye drops and Zyrtec to be sent to her pharmacy. Pt understands that the prescriptions may not be filled, but states her Pharmacy helps her figure out how to get her needed over the counter meds. Her pharmacy is [facility].

## 2019-10-17 NOTE — Unmapped (Signed)
This encounter was created in error - please disregard.

## 2019-10-31 ENCOUNTER — Institutional Professional Consult (permissible substitution)
Admit: 2019-10-31 | Discharge: 2019-11-01 | Payer: MEDICARE | Attending: Allergy & Immunology | Primary: Allergy & Immunology

## 2019-10-31 DIAGNOSIS — L209 Atopic dermatitis, unspecified: Principal | ICD-10-CM

## 2019-10-31 DIAGNOSIS — D8942 Idiopathic mast cell activation syndrome: Principal | ICD-10-CM

## 2019-10-31 DIAGNOSIS — D8940 Mast cell activation, unspecified: Principal | ICD-10-CM

## 2019-10-31 DIAGNOSIS — J4540 Moderate persistent asthma, uncomplicated: Principal | ICD-10-CM

## 2019-10-31 DIAGNOSIS — L501 Idiopathic urticaria: Principal | ICD-10-CM

## 2019-10-31 MED ORDER — FLUTICASONE PROPIONATE 50 MCG/ACTUATION NASAL SPRAY,SUSPENSION
Freq: Every day | NASAL | 12 refills | 0.00000 days | Status: CP
Start: 2019-10-31 — End: ?

## 2019-10-31 MED ORDER — HYDROXYZINE PAMOATE 25 MG CAPSULE
ORAL_CAPSULE | 11 refills | 0 days
Start: 2019-10-31 — End: ?

## 2019-10-31 MED ORDER — HYDROXYCHLOROQUINE 200 MG TABLET
ORAL_TABLET | Freq: Two times a day (BID) | ORAL | 11 refills | 30 days | Status: CP
Start: 2019-10-31 — End: 2020-10-30

## 2019-10-31 MED ORDER — FLUTICASONE PROPIONATE 44 MCG/ACTUATION HFA AEROSOL INHALER
Freq: Two times a day (BID) | RESPIRATORY_TRACT | 11 refills | 0 days | Status: CP
Start: 2019-10-31 — End: ?

## 2019-10-31 MED ORDER — TRIAMCINOLONE ACETONIDE 0.1 % TOPICAL OINTMENT
Freq: Two times a day (BID) | TOPICAL | 11 refills | 0 days | Status: CP
Start: 2019-10-31 — End: 2020-10-30

## 2019-10-31 MED ORDER — HYDROXYZINE PAMOATE 100 MG CAPSULE
ORAL_CAPSULE | Freq: Every evening | ORAL | 0 refills | 30.00000 days | Status: CP
Start: 2019-10-31 — End: ?
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
## 2019-10-31 NOTE — Unmapped (Signed)
[facility] at [facility] Building  [address]  [address], [place]  [place], [place] [address]  (Please note this location is as of March 2021)        Your provider today was: Mirian Capuchin, FNP-BC Edson Snowball like Annabelle Harman and Severance rhymes w/ dry)    Thank you for letting me participate in your medical care!    Contact Information:    Appointments and Referrals                                                           [phone]     Urgent Questions [phone]  Ask for the Allergy/Immunology Doctor on call     Use [facility] ([email or web address] to request refills, access test results, and send questions to your provider!    Difficulty breathing, shortness of breath are medical emergencies please seek emergency medical care or call 9-1-1 if you have these symptoms.     Cost Saving Tips:  Generics are your friends!  The big box discount stores typically have the lowest priced generic drugs for example: [employer] offers (on average) 400 tabs of generic zyrtec / cetirizine for around $16.  Talk to the pharmacist, they can help you locate the generic versions of the drugs.    For patients w/ private insurance (does not include Medicaid / Medicare) consider using google to look for manufacturer's coupons, some inhalers / drugs may have a $0 co-pay. This typically is not applicable for private-pay or Medicare / Medicaid patients.    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   Degenerative joint changes likely not related to MCAS, but has been seen in other patients with MCAS; however pathophysiology doesn't give a direct relation back to mast cell activation. Recommend patient continue to pursue physical therapy and ortho treatment, pain management.     Plan:  - H1 (nondrowsy) antihistamine:  Zyrtec 20mg  twice daily  - H1 (DROWSY) antihistamine: hydroxyzine 100mg  at bedtime, 50mg  prn throughout the day (typically twice daily)  - H2 (GI tract) antihistamine: famotidine 40mg  twice daily  - Mast cell stabilizer: cromolyn 200mg  QID, may take extra dose daily for GI symptoms  - Leukotriene antagonist: 10mg  once  - Natural supplements (act as mast cell stabilizers): none, is considering vitamin C  - Immunomodulators: 300mg  Xolair every 2 weeks, tolerating well, denies concerns; breakthroughs w/ facial and lip angioedema, also eye angioedema a few days prior to next dose being due.Plaquenil 200mg  twice daily, last eye exam: unsure not since starting plaquenil.  - Emergency medicines to use (in addition to controller medicines) during a flare:  Hydroxyzine 50mg , benadryl, epipen is current.        Idiopathic urticaria    Idiopathic mast cell activation syndrome (CMS-HCC)    Idiopathic angio-edema-urticaria         Problem List Items Addressed This Visit        Other    Mast cell activation syndrome (CMS-HCC)    Relevant Medications    fluticasone propionate (FLOVENT HFA) 44 mcg/actuation inhaler    fluticasone propionate (FLONASE) 50 mcg/actuation nasal spray    hydrOXYzine (VISTARIL) 100 MG capsule  hydrOXYzine (VISTARIL) 25 MG capsule      Other Visit Diagnoses     Idiopathic urticaria        Relevant Medications    hydrOXYzine (VISTARIL) 25 MG capsule    Idiopathic mast cell activation syndrome (CMS-HCC)        Relevant Medications    fluticasone propionate (FLOVENT HFA) 44 mcg/actuation inhaler    fluticasone propionate (FLONASE) 50 mcg/actuation nasal spray    hydrOXYzine (VISTARIL) 100 MG capsule    hydrOXYzine (VISTARIL) 25 MG capsule    hydrOXYchloroQUINE (PLAQUENIL) 200 mg tablet    Idiopathic angio-edema-urticaria        Relevant Medications    fluticasone propionate (FLOVENT HFA) 44 mcg/actuation inhaler    fluticasone propionate (FLONASE) 50 mcg/actuation nasal spray    hydrOXYzine (VISTARIL) 100 MG capsule    hydrOXYzine (VISTARIL) 25 MG capsule hydrOXYchloroQUINE (PLAQUENIL) 200 mg tablet

## 2019-11-03 NOTE — Unmapped (Unsigned)
[facility] Clinical Assessment & Refill Coordination Note  Medication: Xolair    Unable to reach patient to schedule shipment for medication being filled at [facility]. Left voicemail on phone.  As this is the 3rd unsuccessful attempt to reach the patient, no additional phone call attempts will be made at this time.      Phone numbers attempted: [phone]   Last scheduled delivery: 10/01/19    Please call the [facility] at [phone] (option [phone]) should you have any further questions.      Thanks,  [facility] Specialty Team

## 2019-11-13 DIAGNOSIS — D8949 Other mast cell activation disorder: Principal | ICD-10-CM

## 2019-11-13 MED ORDER — ALBUTEROL SULFATE 2 MG/5 ML ORAL SYRUP
Freq: Four times a day (QID) | ORAL | 2 refills | 6.00000 days | Status: CP | PRN
Start: 2019-11-13 — End: ?

## 2020-01-22 MED ORDER — HYDROXYCHLOROQUINE 200 MG TABLET
Freq: Two times a day (BID) | ORAL | 0.00000 days
Start: 2020-01-22 — End: ?

## 2020-03-04 ENCOUNTER — Ambulatory Visit: Admit: 2020-03-04 | Payer: MEDICARE | Attending: Allergy & Immunology | Primary: Allergy & Immunology

## 2020-03-04 NOTE — Unmapped (Signed)
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  An additional 15 minutes was spent on pre and post visit activities.     Harvie Junior, MD PhD  Assistant Professor of Medicine  Division of Rheumatology, Allergy & Immunology  [facility]

## 2020-03-16 MED ORDER — HYDROXYZINE PAMOATE 100 MG CAPSULE
ORAL_CAPSULE | Freq: Every evening | ORAL | 11 refills | 30 days | Status: CP
Start: 2020-03-16 — End: ?

## 2020-03-16 NOTE — Unmapped (Addendum)
It was a pleasure seeing you today.  You saw Dr. Hoy Finlay (rhymes with Naoma Diener).    If you have any non-urgent questions or concerns, you can contact me (Dr. Hoy Finlay) phone at [phone];  by fax at [phone]; by email at [email or web address]; or via MyChart ([email or web address]).     *PLEASE NOTE* It can take [place] up to four weeks to address messages sent through MyChart. So if you have a concern that needs to be addressed before your next follow up visit with Dr. Hoy Finlay, please call [phone] and leave a message on the [facility] Nursing Line OR call [phone] and ask for the [facility] Allergy/Immunology doctor on call.       [email or web address]    We discussed:    Plan:    1.  For the chronic allergic rhinitis with worsening postnasal drip: We will have a trial of Nasalcrom 2 sprays to each nostril up to 4 times a day for the next week to see if this will improve symptoms.    2.  If Nasalcrom was not helpful, the patient can also try Intal which is inhaled cromolyn through her nebulizer.    3.  We have refilled the patient's hydroxyzine(Vistaril), the 100 mg capsule which she takes at nighttime.    4.  For the patient's mast cell activation syndrome we will make no sense of changes to her regimen:  - H1 (nondrowsy) antihistamine:  Zyrtec 10mg  twice daily  - H1 (DROWSY) antihistamine: hydroxyzine 100mg  at bedtime, 25-50 mg Q6H for the last 6 weeks; Doxepin 20 mg daily  - H2 (GI tract) antihistamine: famotidine 40mg  twice daily  - Mast cell stabilizer: cromolyn 150mg  two to four times a day, may take extra dose daily as needed for GI symptoms; ketotifen eyedrops BY MOUTH as needed.  - Leukotriene antagonist: 10mg  montelukast once a day  - Natural supplements (act as mast cell stabilizers): none, is considering vitamin C  - Immunomodulators:   Continue: Dupixent 300mg  subcutaneously every 2 weeks. Despite being prescribed for asthma, goal of switching therapies is that this biologic immunmodulator will stabilize the mast cells and possibly while improving asthma, give patient improved control of MCAS.  CONTINUE: Plaquenil 200mg  twice daily, last eye exam: Schedule an eye exam and let the ophthalmologist know that you are on Plaquenil  - Emergency medicines continue to use (in addition to controller medicines) during a flare:  Hydroxyzine 50mg , benadryl, albuterol liquid, epipen for severe reactions, if used please seek emergency medical attention, keep epipen available at all times.    5.  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  I recommended that she get a booster of her COVID-19 vaccine.  There are some blood work tests from January 2021 to reexplore the patient's immune system given her age issues with difficulty with wound healing.  She cannot obtain these today because of the crash in the epic system.  However she can obtain these at her convenience or at her follow-up visit with me.      ICD-10-CM   1. 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 Chronic allergic rhinitis  J30.9   5. Postural orthostatic tachycardia syndrome  I49.8  6. Dysautonomia (CMS-HCC)  G90.1   7. Moderate persistent asthma without complication  J45.40   8. Immunization counseling  Z71.89       No orders of the defined types were placed in this encounter.      Requested Prescriptions     Signed Prescriptions Disp Refills   ??? hydrOXYzine (VISTARIL) 100 MG capsule 30 capsule 11     Sig: Take 1 capsule (100 mg total) by mouth nightly.       Return in about 6 months (around 09/04/2020).

## 2020-03-22 DIAGNOSIS — D8940 Mast cell activation, unspecified: Principal | ICD-10-CM

## 2020-03-22 DIAGNOSIS — L501 Idiopathic urticaria: Principal | ICD-10-CM

## 2020-03-22 MED ORDER — HYDROXYZINE (ATARAX) 50 MG CAPSULE/TABLET WRAPPER
ORAL_TABLET | Freq: Every evening | ORAL | 0 refills | 30 days | Status: CP
Start: 2020-03-22 — End: ?

## 2020-03-24 NOTE — Unmapped (Signed)
This encounter was created in error - please disregard.

## 2020-04-01 NOTE — Unmapped (Signed)
This patient has been disenrolled from the [facility] specialty pharmacy services due to a change in therapy. The patient is now taking Dupixent and is not filling at the [facility].    Sandy Chandler  [facility] Specialty Pharmacist

## 2020-04-09 DIAGNOSIS — L501 Idiopathic urticaria: Principal | ICD-10-CM

## 2020-04-09 DIAGNOSIS — D8940 Mast cell activation, unspecified: Principal | ICD-10-CM

## 2020-04-09 IMAGING — CT CT ABD-PELV W/ CM
2 of 4 series · 17 of 46 positions shown, 19 images · IV contrast (omnipaque)
Comparison: None.

CLINICAL DATA: Nausea, vomiting and abdominal pain. Hypotension.

EXAM:
CT ABDOMEN AND PELVIS WITH CONTRAST
TECHNIQUE: Multidetector CT imaging of the abdomen and pelvis was performed
using the standard protocol following bolus administration of
intravenous contrast.
CONTRAST:  100mL OMNIPAQUE IOHEXOL 300 MG/ML  SOLN

[Series 2: routine abd/pel with · axial · 0.78mm/px · z∈[-368,+57]mm · 14 of 95 slices shown, 16 images]
[im 5/95  soft-tissue]
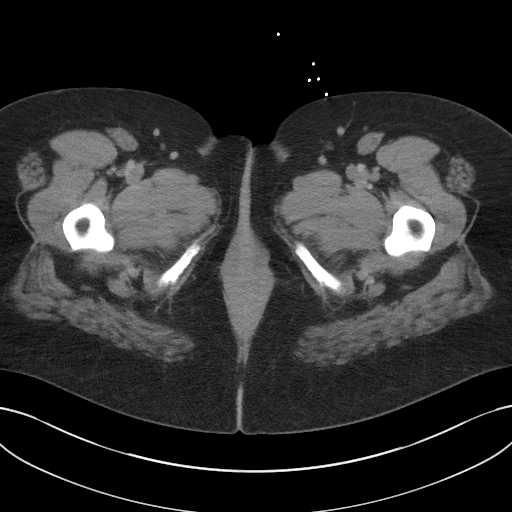
[im 5/95  bone]
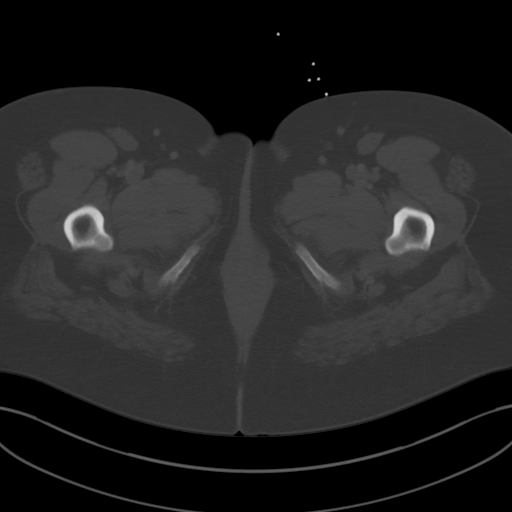
[im 13/95  soft-tissue]
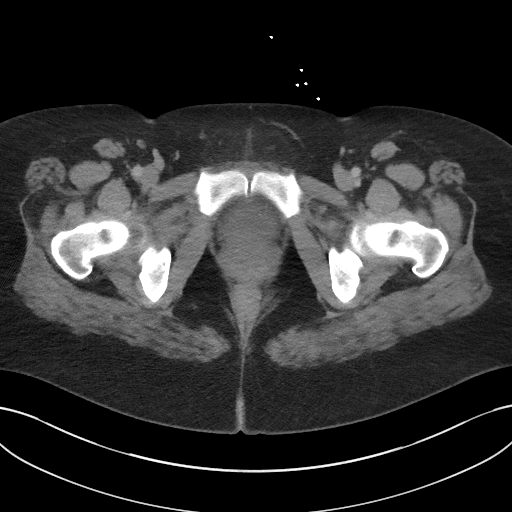
[im 17/95  soft-tissue]
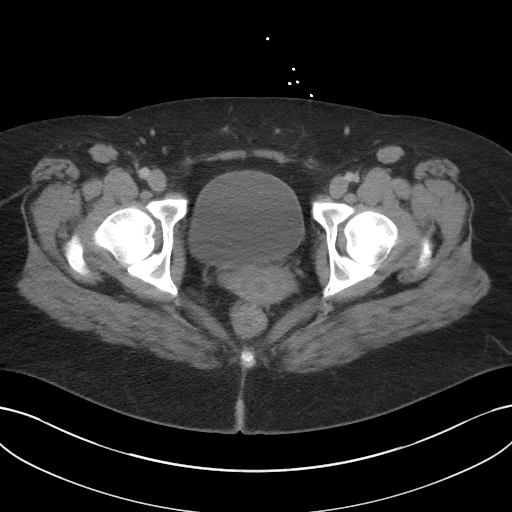
[im 25/95  soft-tissue]
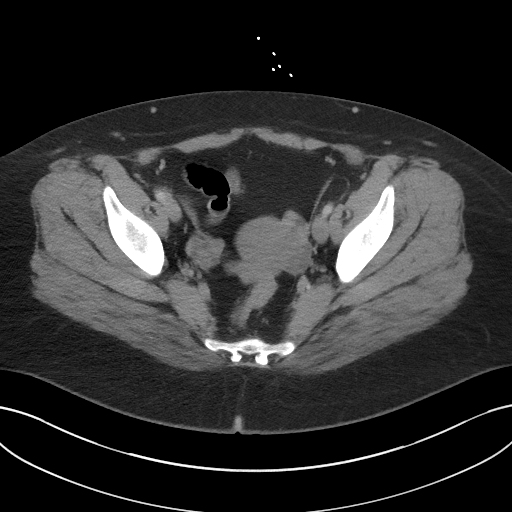
[im 33/95  soft-tissue]
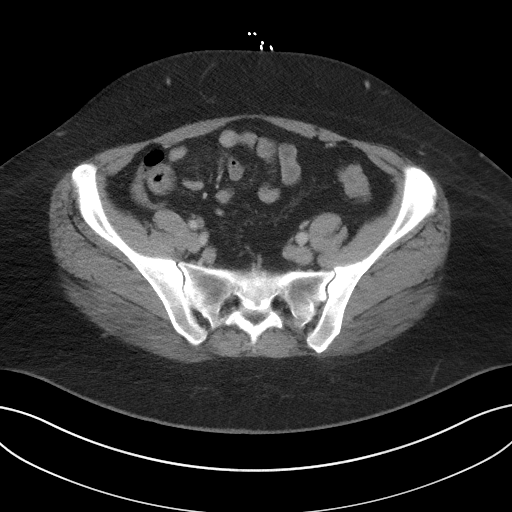
[im 37/95  soft-tissue]
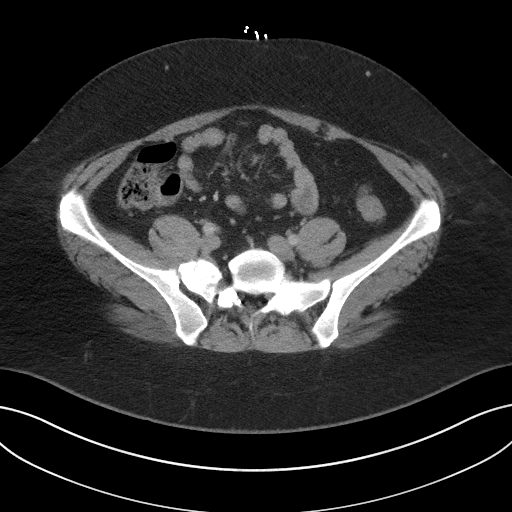
[im 45/95  soft-tissue]
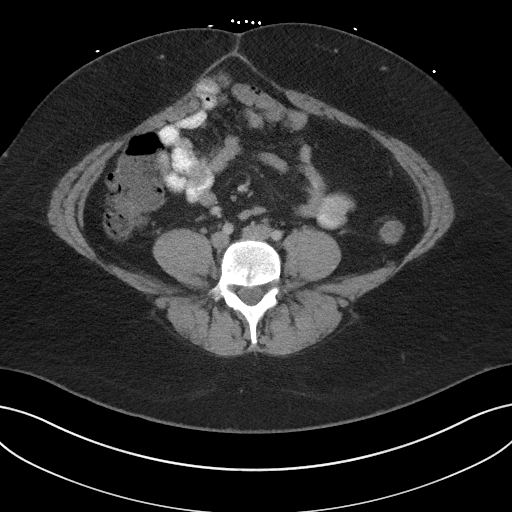
[im 50/95  soft-tissue]
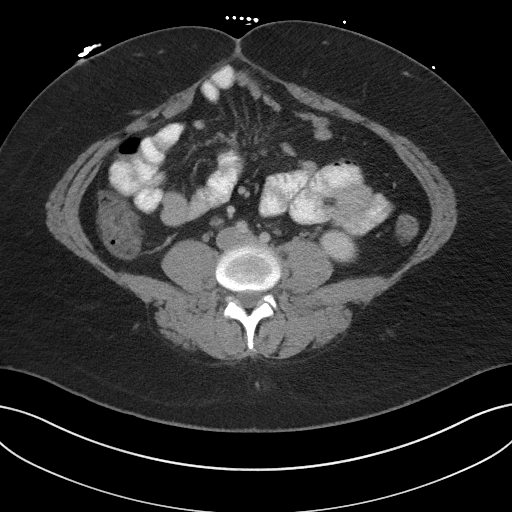
[im 58/95  soft-tissue]
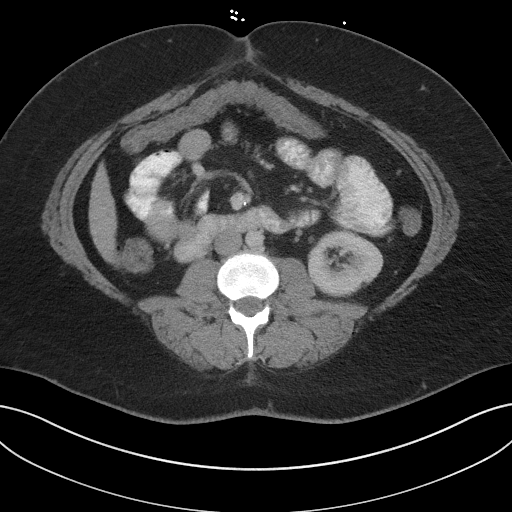
[im 58/95  bone]
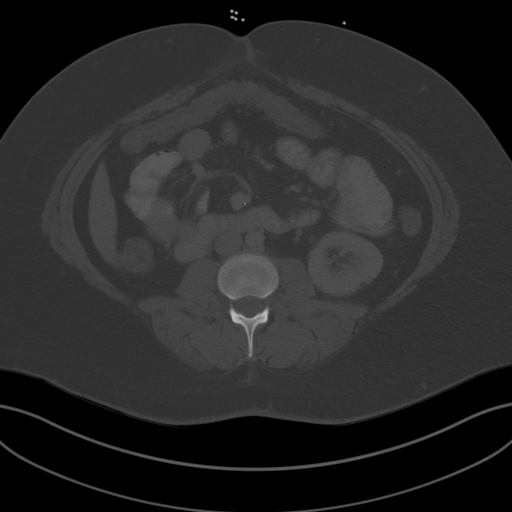
[im 62/95  soft-tissue]
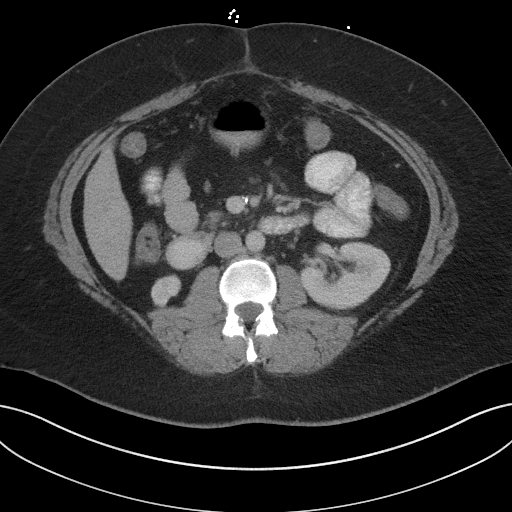
[im 70/95  soft-tissue]
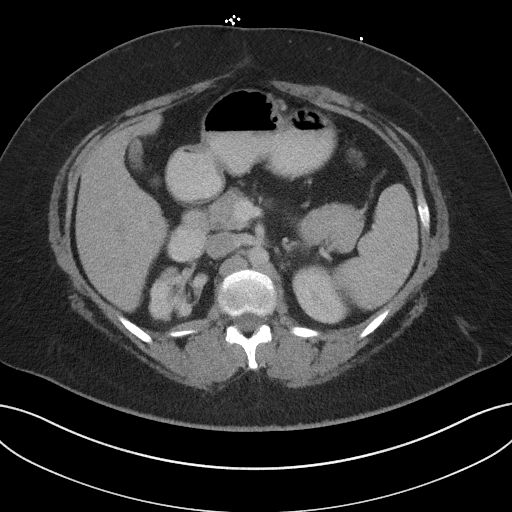
[im 78/95  soft-tissue]
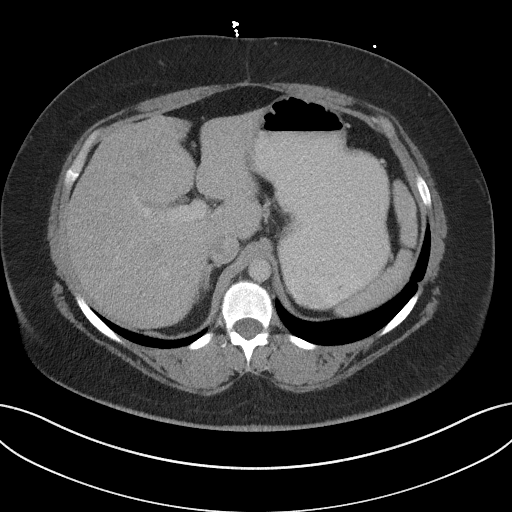
[im 82/95  soft-tissue]
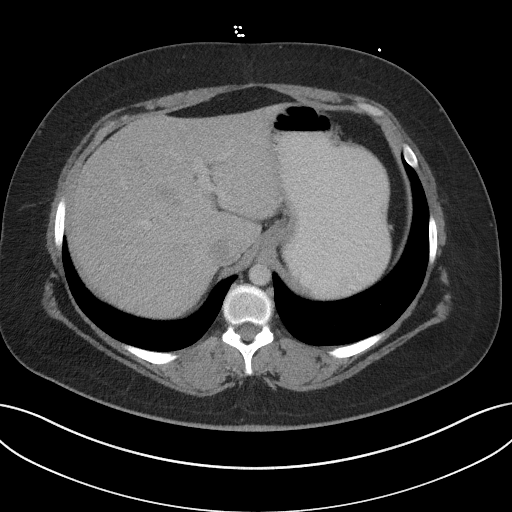
[im 90/95  soft-tissue]
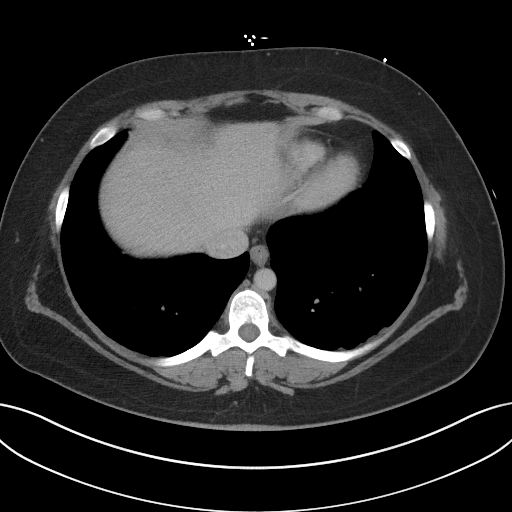

[Series 5: coronal st · coronal · 0.95mm/px · 3 of 102 slices shown]
[im 34/102  soft-tissue]
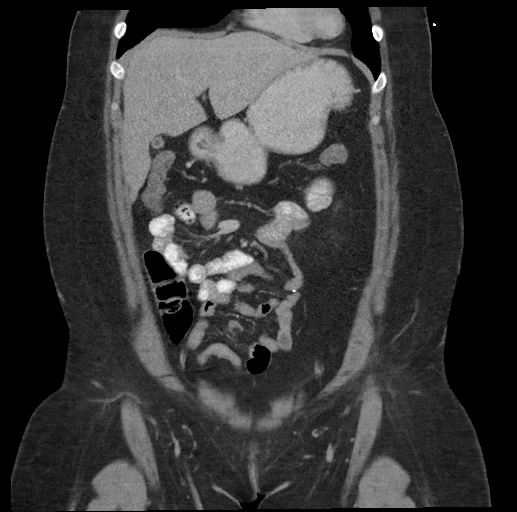
[im 45/102  soft-tissue]
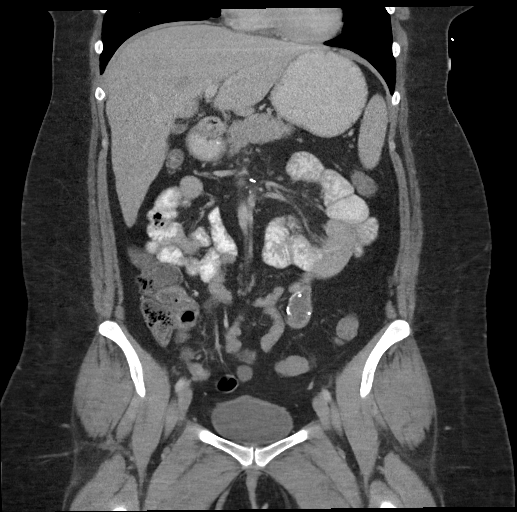
[im 57/102  soft-tissue]
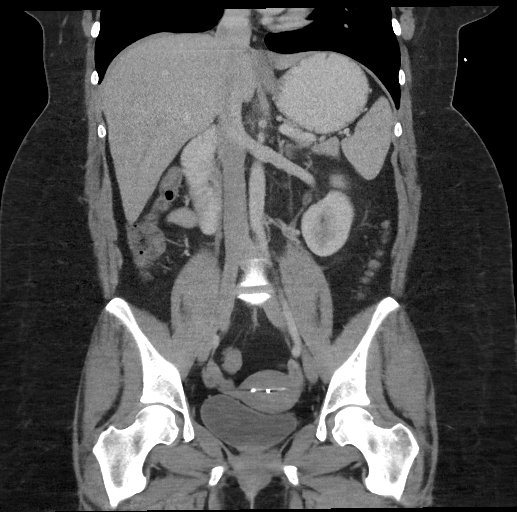

[17 of 46 positions shown; findings below may reference images not displayed]

FINDINGS: LOWER CHEST: There is no basilar pleural or apical pericardial
effusion.

HEPATOBILIARY: The hepatic contours and density are normal. There is
no intra- or extrahepatic biliary dilatation. Gallbladder is
contracted.

PANCREAS: The pancreatic parenchymal contours are normal and there
is no ductal dilatation. There is no peripancreatic fluid
collection.

SPLEEN: Normal.

ADRENALS/URINARY TRACT:

--Adrenal glands: Normal.

--Right kidney/ureter: Right renal atrophy.

--Left kidney/ureter: No hydronephrosis, nephroureterolithiasis,
perinephric stranding or solid renal mass.

--Urinary bladder: Normal for degree of distention

STOMACH/BOWEL:

--Stomach/Duodenum: There is no hiatal hernia or other gastric
abnormality. The duodenal course and caliber are normal.

--Small bowel: No dilatation or inflammation. Postsurgical changes
of small bowel in the left lower quadrant.

--Colon: No focal abnormality.

--Appendix: Normal.

VASCULAR/LYMPHATIC: Normal course and caliber of the major abdominal
vessels. No abdominal or pelvic lymphadenopathy.

REPRODUCTIVE: T-shaped contraceptive device in the uterus. Normal
ovaries..

MUSCULOSKELETAL. No bony spinal canal stenosis or focal osseous
abnormality.

OTHER: None.
IMPRESSION: 1. No acute abdominal or pelvic abnormality.
2. Postsurgical changes of left lower quadrant small bowel.
3. Abnormal appearance of the right kidney, likely chronic atrophy
or postsurgical change.

## 2020-04-09 MED ORDER — XOLAIR 150 MG/ML SUBCUTANEOUS SYRINGE
SUBCUTANEOUS | 12 refills | 28 days | Status: CN
Start: 2020-04-09 — End: ?

## 2020-04-26 ENCOUNTER — Other Ambulatory Visit: Admit: 2020-04-26 | Discharge: 2020-04-27 | Payer: MEDICARE

## 2020-04-26 DIAGNOSIS — L089 Local infection of the skin and subcutaneous tissue, unspecified: Principal | ICD-10-CM

## 2020-04-26 DIAGNOSIS — T148XXD Other injury of unspecified body region, subsequent encounter: Principal | ICD-10-CM

## 2020-04-26 DIAGNOSIS — B372 Candidiasis of skin and nail: Principal | ICD-10-CM

## 2020-04-26 DIAGNOSIS — D8989 Other specified disorders involving the immune mechanism, not elsewhere classified: Principal | ICD-10-CM

## 2020-04-30 IMAGING — CR DG CERVICAL SPINE 2 OR 3 VIEWS
1 series · 4 of 4 positions shown · non-contrast
Comparison: None.

CLINICAL DATA: Neck pain beginning today after lifting heavy
object. Initial encounter.

EXAM:
CERVICAL SPINE - 2-3 VIEW

[Series 1: dg cervical spine 2 or 3 views · 0.14mm/px · 4 of 4 slices shown]
[im 1/4]
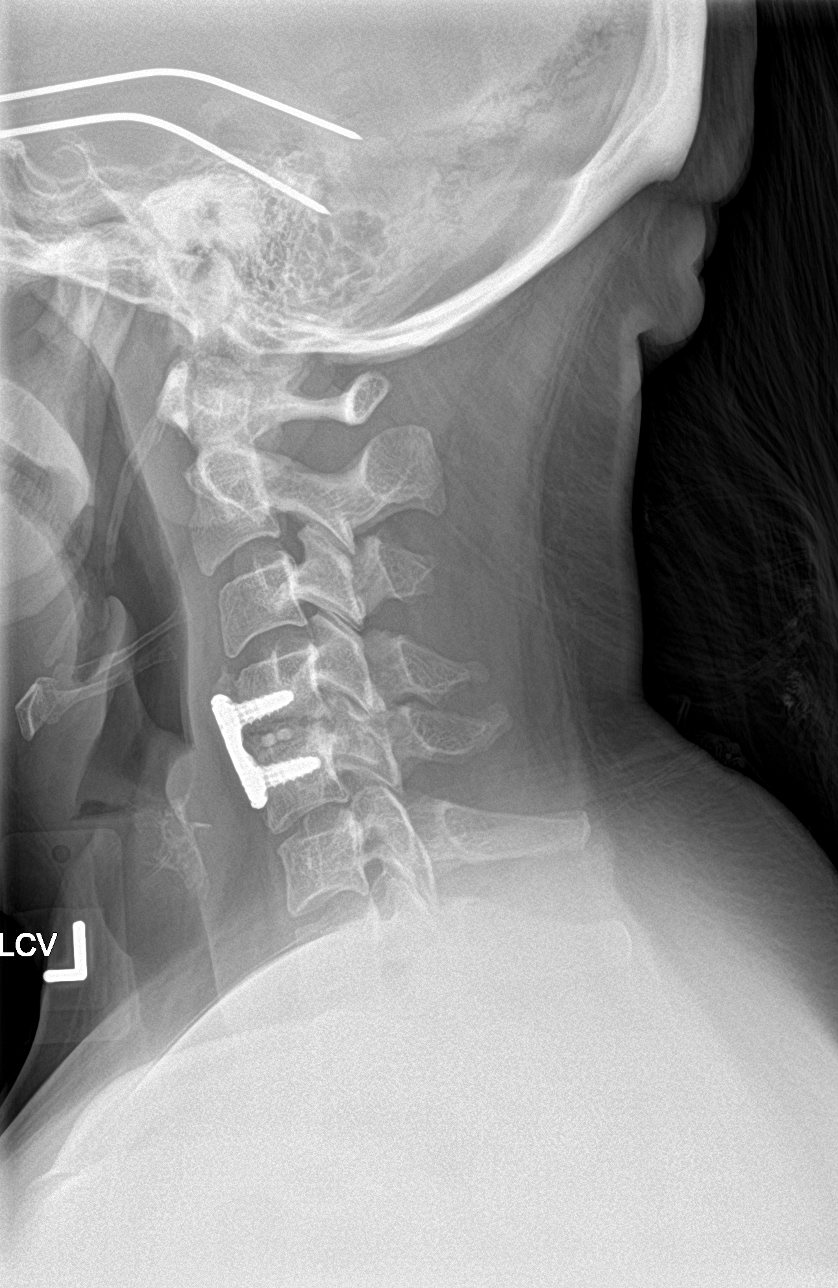
[im 2/4]
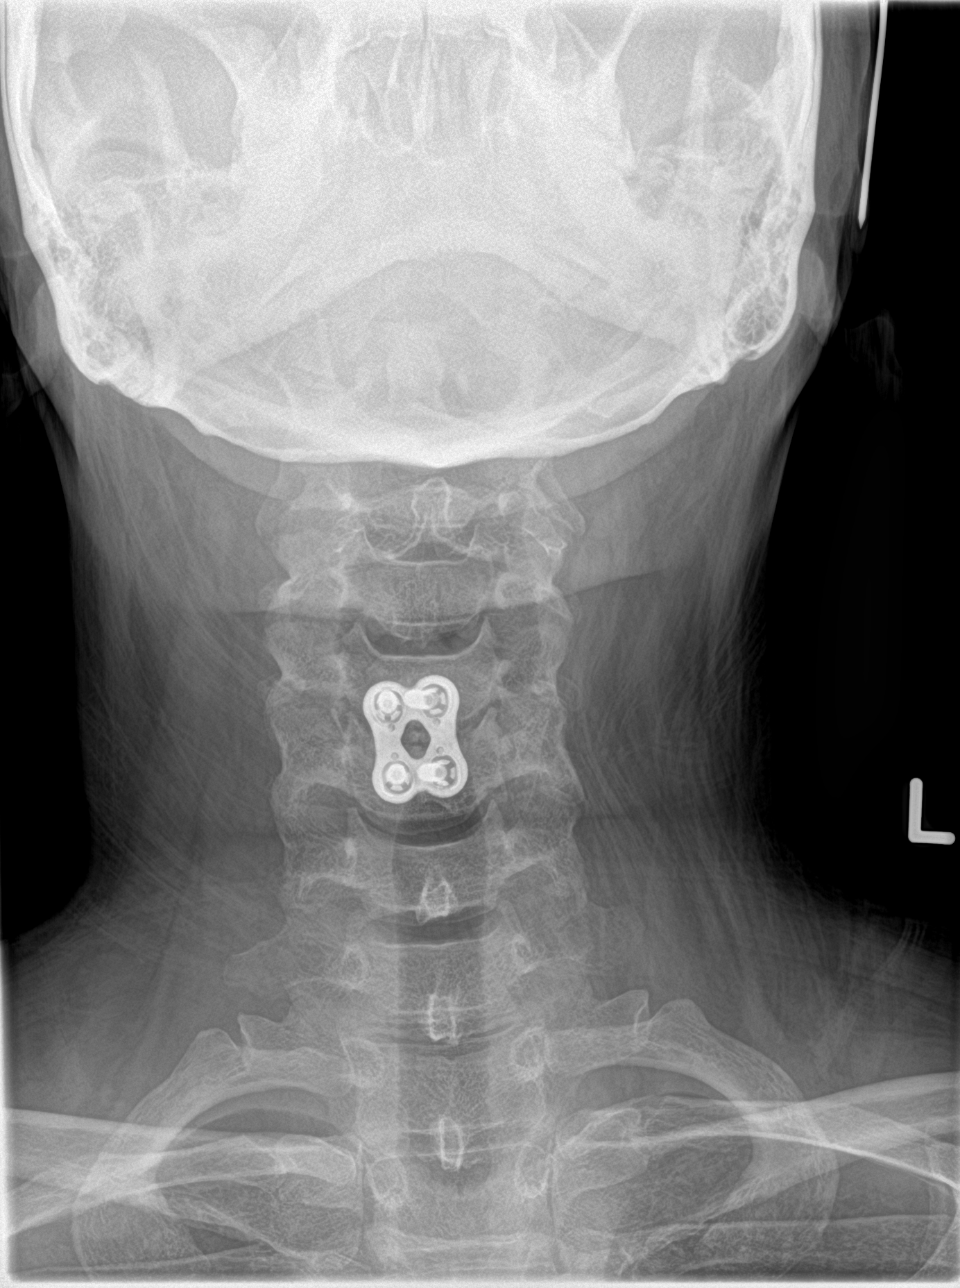
[im 3/4]
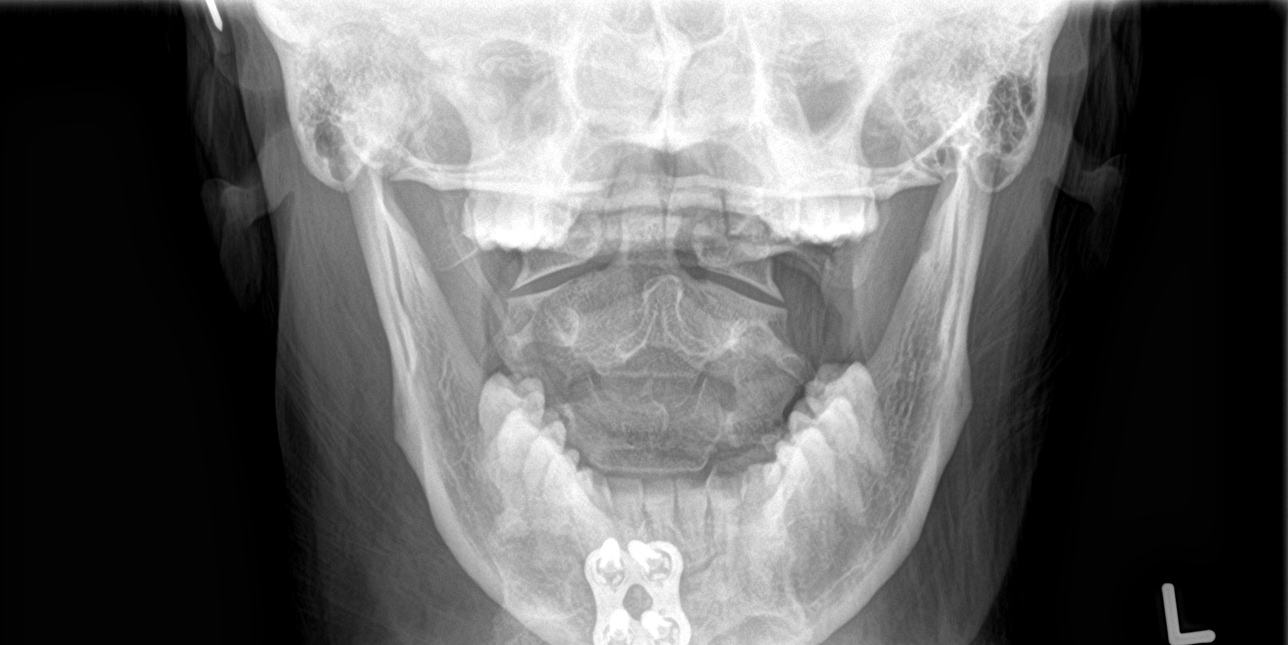
[im 4/4]
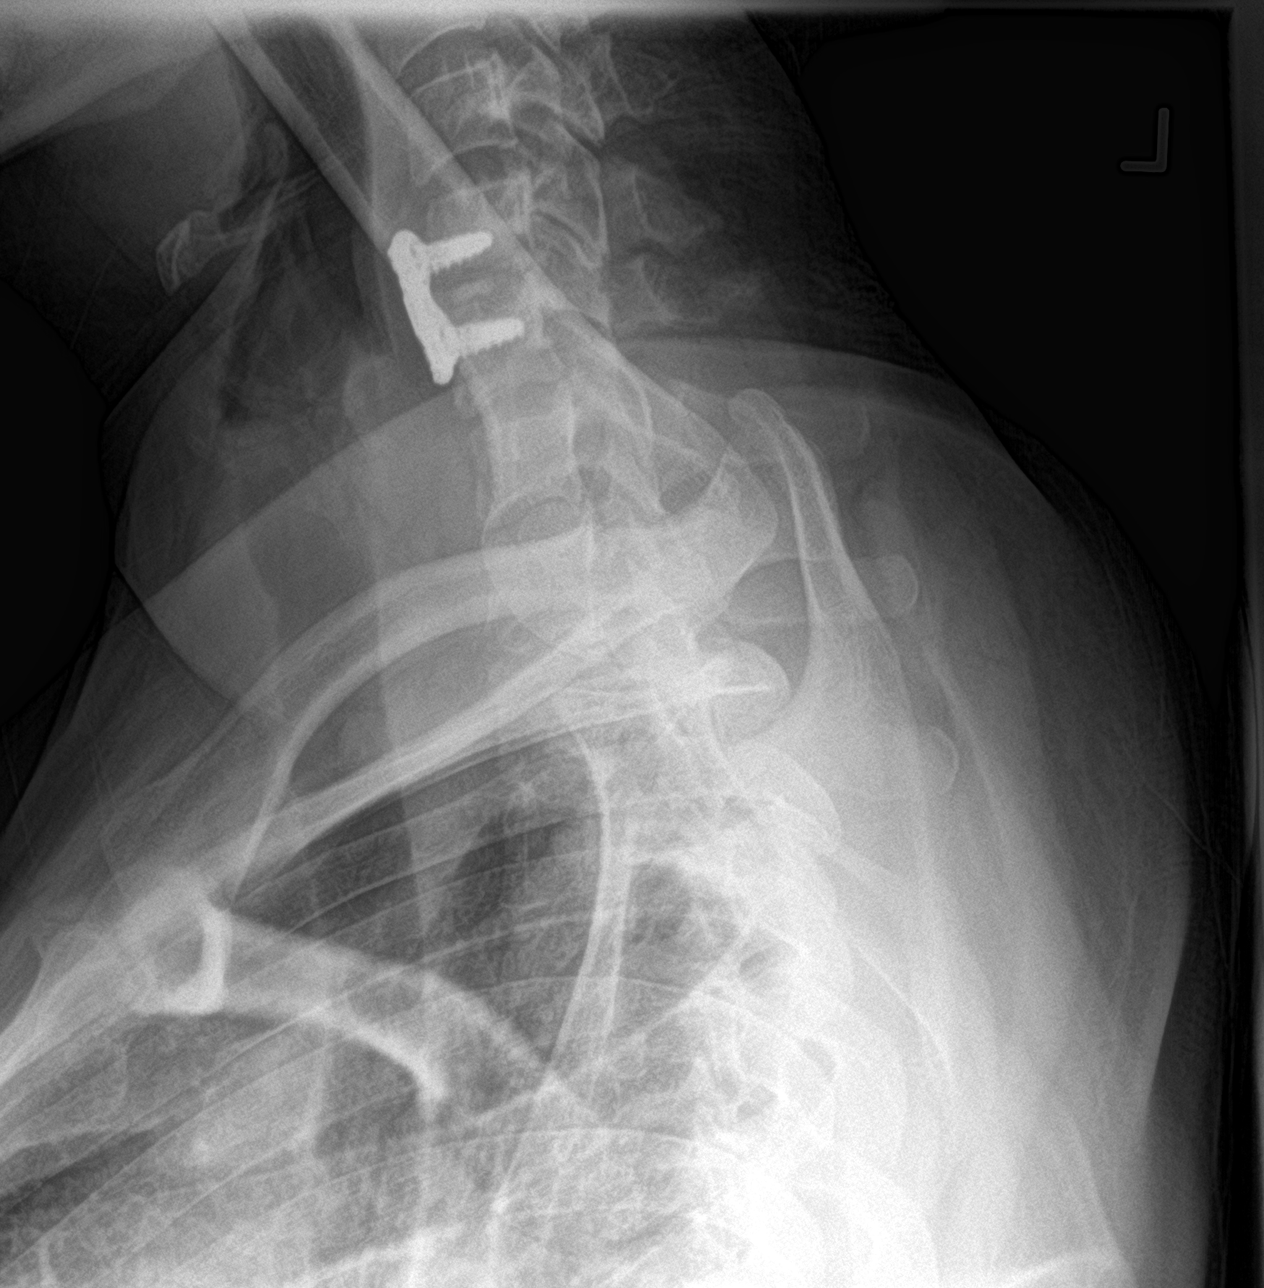

[4 of 4 positions shown; findings below may reference images not displayed]

FINDINGS: There is no evidence of cervical spine fracture or prevertebral soft
tissue swelling. Alignment is normal. Previous anterior cervical
disc fusion is seen at C4-5. Other intervertebral disc spaces are
maintained. No other bone lesion identified.
IMPRESSION: No acute findings.  Previous anterior cervical fusion at C4-5.

## 2020-06-17 DIAGNOSIS — L259 Unspecified contact dermatitis, unspecified cause: Principal | ICD-10-CM

## 2020-06-17 MED ORDER — HYDROCORTISONE 2.5 % TOPICAL OINTMENT
Freq: Two times a day (BID) | TOPICAL | 0 refills | 0.00000 days | Status: CP
Start: 2020-06-17 — End: 2021-06-17

## 2020-06-25 DIAGNOSIS — D8940 Mast cell activation, unspecified: Principal | ICD-10-CM

## 2020-06-25 DIAGNOSIS — L501 Idiopathic urticaria: Principal | ICD-10-CM

## 2020-06-25 MED ORDER — EPINEPHRINE 0.3 MG/0.3 ML INJECTION, AUTO-INJECTOR
Freq: Once | INTRAMUSCULAR | 6 refills | 0 days | Status: CP | PRN
Start: 2020-06-25 — End: ?

## 2020-07-01 ENCOUNTER — Telehealth
Admit: 2020-07-01 | Discharge: 2020-07-02 | Payer: MEDICARE | Attending: Allergy & Immunology | Primary: Allergy & Immunology

## 2020-07-01 DIAGNOSIS — I498 Other specified cardiac arrhythmias: Principal | ICD-10-CM

## 2020-07-01 DIAGNOSIS — D8940 Mast cell activation, unspecified: Principal | ICD-10-CM

## 2020-07-01 DIAGNOSIS — J309 Allergic rhinitis, unspecified: Principal | ICD-10-CM

## 2020-07-01 DIAGNOSIS — J4540 Moderate persistent asthma, uncomplicated: Principal | ICD-10-CM

## 2020-07-01 DIAGNOSIS — G901 Familial dysautonomia [Riley-Day]: Principal | ICD-10-CM

## 2020-07-01 DIAGNOSIS — L501 Idiopathic urticaria: Principal | ICD-10-CM

## 2020-07-03 MED ORDER — DESONIDE 0.05 % TOPICAL OINTMENT
TOPICAL | 3 refills | 0.00000 days | Status: CP
Start: 2020-07-03 — End: ?

## 2020-11-10 DIAGNOSIS — J309 Allergic rhinitis, unspecified: Principal | ICD-10-CM

## 2020-11-10 DIAGNOSIS — J4540 Moderate persistent asthma, uncomplicated: Principal | ICD-10-CM

## 2020-11-10 MED ORDER — DUPILUMAB 300 MG/2 ML SUBCUTANEOUS SYRINGE
SUBCUTANEOUS | 11 refills | 28 days
Start: 2020-11-10 — End: ?

## 2020-11-11 MED ORDER — DUPILUMAB 300 MG/2 ML SUBCUTANEOUS SYRINGE
SUBCUTANEOUS | 11 refills | 28.00000 days | Status: CP
Start: 2020-11-11 — End: ?

## 2020-12-15 DIAGNOSIS — K219 Gastro-esophageal reflux disease without esophagitis: Principal | ICD-10-CM

## 2020-12-15 DIAGNOSIS — D8949 Other mast cell activation disorder: Principal | ICD-10-CM

## 2020-12-15 DIAGNOSIS — L501 Idiopathic urticaria: Principal | ICD-10-CM

## 2020-12-15 DIAGNOSIS — D8942 Idiopathic mast cell activation syndrome: Principal | ICD-10-CM

## 2020-12-15 MED ORDER — ESOMEPRAZOLE MAGNESIUM 40 MG CAPSULE,DELAYED RELEASE
ORAL_CAPSULE | Freq: Every day | ORAL | 0 refills | 90 days
Start: 2020-12-15 — End: ?

## 2020-12-15 MED ORDER — CYCLOSPORINE MODIFIED 100 MG/ML ORAL SOLUTION
Freq: Two times a day (BID) | ORAL | 1 refills | 30.00000 days | Status: CP
Start: 2020-12-15 — End: 2021-12-15

## 2020-12-15 MED ORDER — CROMOLYN 100 MG/5 ML ORAL CONCENTRATE
Freq: Four times a day (QID) | ORAL | 11 refills | 30 days | Status: CP
Start: 2020-12-15 — End: 2021-12-15

## 2020-12-15 MED ORDER — FAMOTIDINE 40 MG TABLET
ORAL_TABLET | Freq: Two times a day (BID) | ORAL | 0 refills | 30 days
Start: 2020-12-15 — End: 2021-01-14

## 2020-12-18 MED ORDER — KETOTIFEN FUMARATE (BULK) 100 % POWDER
11 refills | 0 days | Status: CP
Start: 2020-12-18 — End: ?

## 2021-01-13 ENCOUNTER — Telehealth
Admit: 2021-01-13 | Discharge: 2021-01-14 | Payer: MEDICARE | Attending: Allergy & Immunology | Primary: Allergy & Immunology

## 2021-01-13 DIAGNOSIS — L501 Idiopathic urticaria: Principal | ICD-10-CM

## 2021-01-13 DIAGNOSIS — L209 Atopic dermatitis, unspecified: Principal | ICD-10-CM

## 2021-01-13 DIAGNOSIS — R748 Abnormal levels of other serum enzymes: Principal | ICD-10-CM

## 2021-01-13 DIAGNOSIS — R197 Diarrhea, unspecified: Principal | ICD-10-CM

## 2021-01-13 DIAGNOSIS — D8942 Idiopathic mast cell activation syndrome: Principal | ICD-10-CM

## 2021-01-13 DIAGNOSIS — Z5181 Encounter for therapeutic drug level monitoring: Principal | ICD-10-CM

## 2021-01-13 DIAGNOSIS — J4540 Moderate persistent asthma, uncomplicated: Principal | ICD-10-CM

## 2021-02-28 DIAGNOSIS — D8949 Other mast cell activation disorder: Principal | ICD-10-CM

## 2021-02-28 MED ORDER — CYCLOSPORINE MODIFIED 100 MG/ML ORAL SOLUTION
Freq: Two times a day (BID) | ORAL | 1 refills | 30.00000 days | Status: CP
Start: 2021-02-28 — End: 2022-02-28

## 2021-04-06 DIAGNOSIS — D8989 Other specified disorders involving the immune mechanism, not elsewhere classified: Principal | ICD-10-CM

## 2021-04-14 DIAGNOSIS — L501 Idiopathic urticaria: Principal | ICD-10-CM

## 2021-04-14 DIAGNOSIS — D8940 Mast cell activation, unspecified: Principal | ICD-10-CM

## 2021-04-14 MED ORDER — OMALIZUMAB 150 MG/ML SUBCUTANEOUS SYRINGE
SUBCUTANEOUS | 12 refills | 28 days
Start: 2021-04-14 — End: ?

## 2021-04-19 MED ORDER — OMALIZUMAB 150 MG/ML SUBCUTANEOUS SYRINGE
SUBCUTANEOUS | 12 refills | 28.00000 days | Status: CP
Start: 2021-04-19 — End: ?

## 2021-05-20 MED ORDER — MUPIROCIN 2 % TOPICAL OINTMENT
Freq: Two times a day (BID) | TOPICAL | 0 refills | 7 days | Status: CP
Start: 2021-05-20 — End: 2021-05-27

## 2021-06-07 ENCOUNTER — Ambulatory Visit: Admit: 2021-06-07 | Discharge: 2021-06-08 | Payer: MEDICARE

## 2021-06-07 DIAGNOSIS — D8940 Mast cell activation, unspecified: Principal | ICD-10-CM

## 2021-06-07 DIAGNOSIS — Z5181 Encounter for therapeutic drug level monitoring: Principal | ICD-10-CM

## 2021-09-01 ENCOUNTER — Ambulatory Visit
Admit: 2021-09-01 | Discharge: 2021-09-01 | Payer: MEDICARE | Attending: Allergy & Immunology | Primary: Allergy & Immunology

## 2021-09-01 DIAGNOSIS — J309 Allergic rhinitis, unspecified: Principal | ICD-10-CM

## 2021-09-01 DIAGNOSIS — Z79899 Other long term (current) drug therapy: Principal | ICD-10-CM

## 2021-09-01 DIAGNOSIS — K3189 Other diseases of stomach and duodenum: Principal | ICD-10-CM

## 2021-09-01 DIAGNOSIS — Z872 Personal history of diseases of the skin and subcutaneous tissue: Principal | ICD-10-CM

## 2021-09-01 DIAGNOSIS — D8940 Mast cell activation, unspecified: Principal | ICD-10-CM

## 2021-09-01 DIAGNOSIS — L501 Idiopathic urticaria: Principal | ICD-10-CM

## 2021-09-01 MED ORDER — CROMOLYN 4 % EYE DROPS
Freq: Four times a day (QID) | OPHTHALMIC | 12 refills | 0 days | Status: CP
Start: 2021-09-01 — End: 2022-09-01

## 2021-09-01 MED ORDER — EPINEPHRINE 0.3 MG/0.3 ML INJECTION, AUTO-INJECTOR
Freq: Once | INTRAMUSCULAR | 6 refills | 0 days | Status: CP | PRN
Start: 2021-09-01 — End: ?

## 2021-09-02 DIAGNOSIS — D8949 Other mast cell activation disorder: Principal | ICD-10-CM

## 2021-09-02 MED ORDER — CYCLOSPORINE MODIFIED 100 MG/ML ORAL SOLUTION
Freq: Two times a day (BID) | ORAL | 1 refills | 30.00000 days
Start: 2021-09-02 — End: 2022-09-02

## 2021-09-07 MED ORDER — CYCLOSPORINE MODIFIED 100 MG/ML ORAL SOLUTION
Freq: Two times a day (BID) | ORAL | 3 refills | 30 days | Status: CP
Start: 2021-09-07 — End: 2022-01-05

## 2021-10-02 ENCOUNTER — Ambulatory Visit: Admit: 2021-10-02 | Discharge: 2021-10-03 | Payer: MEDICARE

## 2021-10-02 DIAGNOSIS — R21 Rash and other nonspecific skin eruption: Principal | ICD-10-CM

## 2021-10-02 DIAGNOSIS — H6691 Otitis media, unspecified, right ear: Principal | ICD-10-CM

## 2021-10-02 DIAGNOSIS — H60503 Unspecified acute noninfective otitis externa, bilateral: Principal | ICD-10-CM

## 2021-10-02 MED ORDER — MUPIROCIN 2 % TOPICAL OINTMENT
Freq: Three times a day (TID) | TOPICAL | 0 refills | 7 days | Status: CP
Start: 2021-10-02 — End: 2021-10-09

## 2021-10-02 MED ORDER — PREDNISONE 20 MG TABLET
ORAL_TABLET | 0 refills | 0 days | Status: CP
Start: 2021-10-02 — End: ?

## 2021-10-02 MED ORDER — AMOXICILLIN 875 MG-POTASSIUM CLAVULANATE 125 MG TABLET
ORAL_TABLET | Freq: Two times a day (BID) | ORAL | 0 refills | 10 days | Status: CP
Start: 2021-10-02 — End: 2021-10-12

## 2021-10-02 MED ORDER — CIPROFLOXACIN 0.3 %-DEXAMETHASONE 0.1 % EAR DROPS,SUSPENSION
Freq: Two times a day (BID) | OTIC | 0 refills | 19 days | Status: CP
Start: 2021-10-02 — End: 2021-10-09

## 2021-10-20 ENCOUNTER — Telehealth
Admit: 2021-10-20 | Discharge: 2021-10-21 | Payer: MEDICARE | Attending: Allergy & Immunology | Primary: Allergy & Immunology

## 2021-10-20 DIAGNOSIS — D8940 Mast cell activation, unspecified: Principal | ICD-10-CM

## 2021-10-20 DIAGNOSIS — L501 Idiopathic urticaria: Principal | ICD-10-CM

## 2021-10-20 DIAGNOSIS — R197 Diarrhea, unspecified: Principal | ICD-10-CM

## 2021-10-20 DIAGNOSIS — J309 Allergic rhinitis, unspecified: Principal | ICD-10-CM

## 2021-10-20 DIAGNOSIS — G43109 Migraine with aura, not intractable, without status migrainosus: Principal | ICD-10-CM

## 2021-10-26 MED ORDER — PREDNISONE 10 MG TABLET
ORAL_TABLET | 0 refills | 0 days | Status: CP
Start: 2021-10-26 — End: ?

## 2021-11-04 DIAGNOSIS — M50123 Cervical disc disorder at C6-C7 level with radiculopathy: Principal | ICD-10-CM

## 2021-11-29 DIAGNOSIS — L501 Idiopathic urticaria: Principal | ICD-10-CM

## 2021-11-29 DIAGNOSIS — D8940 Mast cell activation, unspecified: Principal | ICD-10-CM

## 2021-11-29 MED ORDER — OMALIZUMAB 150 MG/ML SUBCUTANEOUS SYRINGE
SUBCUTANEOUS | 12 refills | 28 days | Status: CP
Start: 2021-11-29 — End: ?

## 2022-02-28 ENCOUNTER — Telehealth: Admit: 2022-02-28 | Discharge: 2022-03-01 | Payer: MEDICARE

## 2022-02-28 DIAGNOSIS — T380X5A Adverse effect of glucocorticoids and synthetic analogues, initial encounter: Principal | ICD-10-CM

## 2022-02-28 DIAGNOSIS — D832 Common variable immunodeficiency with autoantibodies to B- or T-cells: Principal | ICD-10-CM

## 2022-02-28 DIAGNOSIS — Z7952 Long term (current) use of systemic steroids: Principal | ICD-10-CM

## 2022-02-28 DIAGNOSIS — D84821 Immunodeficiency secondary to steroids (CMS-HCC): Principal | ICD-10-CM

## 2022-02-28 MED ORDER — PRIMATENE MIST 0.125 MG/ACTUATION HFA AEROSOL INHALER
RESPIRATORY_TRACT | 0 refills | 30 days | Status: CP | PRN
Start: 2022-02-28 — End: 2022-03-30

## 2022-02-28 MED ORDER — AEROCHAMBER MV SPACER
Freq: Once | 0 refills | 1 days | Status: CP
Start: 2022-02-28 — End: 2022-02-28

## 2022-06-02 ENCOUNTER — Ambulatory Visit: Admit: 2022-06-02 | Discharge: 2022-06-03 | Payer: MEDICARE

## 2022-06-02 DIAGNOSIS — Z7952 Long term (current) use of systemic steroids: Principal | ICD-10-CM

## 2022-06-02 DIAGNOSIS — D84821 Immunodeficiency secondary to steroids (CMS-HCC): Principal | ICD-10-CM

## 2022-06-02 DIAGNOSIS — D832 Common variable immunodeficiency with autoantibodies to B- or T-cells: Principal | ICD-10-CM

## 2022-06-02 DIAGNOSIS — T380X5A Adverse effect of glucocorticoids and synthetic analogues, initial encounter: Principal | ICD-10-CM

## 2022-08-31 ENCOUNTER — Telehealth
Admit: 2022-08-31 | Discharge: 2022-09-01 | Payer: MEDICARE | Attending: Allergy & Immunology | Primary: Allergy & Immunology

## 2022-08-31 DIAGNOSIS — D4709 Other mast cell neoplasms of uncertain behavior: Principal | ICD-10-CM

## 2022-08-31 DIAGNOSIS — D801 Nonfamilial hypogammaglobulinemia: Principal | ICD-10-CM

## 2022-08-31 DIAGNOSIS — B999 Unspecified infectious disease: Principal | ICD-10-CM

## 2022-08-31 DIAGNOSIS — D8940 Mast cell activation, unspecified: Principal | ICD-10-CM

## 2022-08-31 MED ORDER — PNEUMOCOCCAL 23 POLYVALENT VACCINE 25 MCG/0.5 ML INJECTION SYRINGE
Freq: Once | INTRAMUSCULAR | 0 refills | 1 days | Status: CP
Start: 2022-08-31 — End: 2022-08-31

## 2022-08-31 MED ORDER — MONTELUKAST 5 MG CHEWABLE TABLET
ORAL_TABLET | Freq: Every evening | ORAL | 11 refills | 30 days | Status: CP
Start: 2022-08-31 — End: ?

## 2022-08-31 MED ORDER — DOXEPIN 10 MG/ML ORAL CONCENTRATE
Freq: Three times a day (TID) | ORAL | 0 refills | 20 days | Status: CP
Start: 2022-08-31 — End: ?

## 2022-10-02 DIAGNOSIS — D8940 Mast cell activation, unspecified: Principal | ICD-10-CM

## 2022-10-02 MED ORDER — DOXEPIN 10 MG/ML ORAL CONCENTRATE
0 refills | 0 days
Start: 2022-10-02 — End: ?

## 2022-10-03 MED ORDER — DOXEPIN 10 MG/ML ORAL CONCENTRATE
3 refills | 0 days | Status: CP
Start: 2022-10-03 — End: ?
